# Patient Record
Sex: Female | Born: 1960
Health system: Southern US, Community
[De-identification: ages and names within clinical notes are randomized; demographics above are authoritative.]

## PROBLEM LIST (undated history)

## (undated) DIAGNOSIS — E785 Hyperlipidemia, unspecified: Secondary | ICD-10-CM

## (undated) DIAGNOSIS — A879 Viral meningitis, unspecified: Secondary | ICD-10-CM

## (undated) DIAGNOSIS — Z8742 Personal history of other diseases of the female genital tract: Secondary | ICD-10-CM

## (undated) DIAGNOSIS — I10 Essential (primary) hypertension: Secondary | ICD-10-CM

## (undated) DIAGNOSIS — D72819 Decreased white blood cell count, unspecified: Secondary | ICD-10-CM

## (undated) DIAGNOSIS — B009 Herpesviral infection, unspecified: Secondary | ICD-10-CM

## (undated) DIAGNOSIS — D219 Benign neoplasm of connective and other soft tissue, unspecified: Secondary | ICD-10-CM

## (undated) DIAGNOSIS — D649 Anemia, unspecified: Secondary | ICD-10-CM

## (undated) HISTORY — DX: Essential (primary) hypertension: I10

## (undated) HISTORY — DX: Benign neoplasm of connective and other soft tissue, unspecified: D21.9

## (undated) HISTORY — DX: Personal history of other diseases of the female genital tract: Z87.42

## (undated) HISTORY — DX: Anemia, unspecified: D64.9

## (undated) HISTORY — PX: WISDOM TOOTH EXTRACTION: SHX21

## (undated) HISTORY — DX: Hyperlipidemia, unspecified: E78.5

## (undated) HISTORY — DX: Decreased white blood cell count, unspecified: D72.819

## (undated) HISTORY — DX: Viral meningitis, unspecified: A87.9

## (undated) HISTORY — DX: Herpesviral infection, unspecified: B00.9

---

## 2000-04-10 ENCOUNTER — Other Ambulatory Visit: Admission: RE | Admit: 2000-04-10 | Discharge: 2000-04-10 | Payer: Self-pay | Admitting: Obstetrics and Gynecology

## 2000-07-15 ENCOUNTER — Encounter (INDEPENDENT_AMBULATORY_CARE_PROVIDER_SITE_OTHER): Payer: Self-pay | Admitting: Specialist

## 2000-07-15 ENCOUNTER — Other Ambulatory Visit: Admission: RE | Admit: 2000-07-15 | Discharge: 2000-07-15 | Payer: Self-pay | Admitting: Obstetrics and Gynecology

## 2001-03-12 ENCOUNTER — Ambulatory Visit (HOSPITAL_COMMUNITY): Admission: RE | Admit: 2001-03-12 | Discharge: 2001-03-12 | Payer: Self-pay | Admitting: Obstetrics and Gynecology

## 2001-03-12 ENCOUNTER — Encounter: Payer: Self-pay | Admitting: Obstetrics and Gynecology

## 2001-04-10 ENCOUNTER — Other Ambulatory Visit: Admission: RE | Admit: 2001-04-10 | Discharge: 2001-04-10 | Payer: Self-pay | Admitting: Obstetrics and Gynecology

## 2002-04-13 ENCOUNTER — Other Ambulatory Visit: Admission: RE | Admit: 2002-04-13 | Discharge: 2002-04-13 | Payer: Self-pay | Admitting: Obstetrics and Gynecology

## 2003-04-02 ENCOUNTER — Ambulatory Visit (HOSPITAL_COMMUNITY): Admission: RE | Admit: 2003-04-02 | Discharge: 2003-04-02 | Payer: Self-pay | Admitting: Obstetrics and Gynecology

## 2003-04-02 ENCOUNTER — Encounter: Payer: Self-pay | Admitting: Obstetrics and Gynecology

## 2003-06-29 ENCOUNTER — Other Ambulatory Visit: Admission: RE | Admit: 2003-06-29 | Discharge: 2003-06-29 | Payer: Self-pay | Admitting: Obstetrics and Gynecology

## 2003-12-13 ENCOUNTER — Ambulatory Visit (HOSPITAL_COMMUNITY): Admission: RE | Admit: 2003-12-13 | Discharge: 2003-12-13 | Payer: Self-pay | Admitting: Family Medicine

## 2005-01-16 ENCOUNTER — Other Ambulatory Visit: Admission: RE | Admit: 2005-01-16 | Discharge: 2005-01-16 | Payer: Self-pay | Admitting: Obstetrics and Gynecology

## 2006-02-13 ENCOUNTER — Other Ambulatory Visit: Admission: RE | Admit: 2006-02-13 | Discharge: 2006-02-13 | Payer: Self-pay | Admitting: Obstetrics and Gynecology

## 2007-06-26 ENCOUNTER — Ambulatory Visit (HOSPITAL_COMMUNITY): Admission: RE | Admit: 2007-06-26 | Discharge: 2007-06-26 | Payer: Self-pay | Admitting: Obstetrics and Gynecology

## 2007-11-27 DIAGNOSIS — D72819 Decreased white blood cell count, unspecified: Secondary | ICD-10-CM

## 2007-11-27 HISTORY — DX: Decreased white blood cell count, unspecified: D72.819

## 2008-06-26 DIAGNOSIS — A879 Viral meningitis, unspecified: Secondary | ICD-10-CM

## 2008-06-26 HISTORY — DX: Viral meningitis, unspecified: A87.9

## 2008-07-04 ENCOUNTER — Inpatient Hospital Stay (HOSPITAL_COMMUNITY): Admission: EM | Admit: 2008-07-04 | Discharge: 2008-07-07 | Payer: Self-pay | Admitting: Emergency Medicine

## 2010-02-26 ENCOUNTER — Emergency Department (HOSPITAL_COMMUNITY): Admission: EM | Admit: 2010-02-26 | Discharge: 2010-02-26 | Payer: Self-pay | Admitting: Emergency Medicine

## 2010-12-17 ENCOUNTER — Encounter: Payer: Self-pay | Admitting: Obstetrics and Gynecology

## 2011-01-31 ENCOUNTER — Other Ambulatory Visit (HOSPITAL_COMMUNITY): Payer: Self-pay | Admitting: Family Medicine

## 2011-01-31 DIAGNOSIS — Z139 Encounter for screening, unspecified: Secondary | ICD-10-CM

## 2011-02-01 ENCOUNTER — Ambulatory Visit (HOSPITAL_COMMUNITY)
Admission: RE | Admit: 2011-02-01 | Discharge: 2011-02-01 | Disposition: A | Discharge status: Home / Self Care | Payer: BC Managed Care – PPO | Source: Ambulatory Visit | Attending: Family Medicine | Admitting: Family Medicine

## 2011-02-01 DIAGNOSIS — Z780 Asymptomatic menopausal state: Secondary | ICD-10-CM | POA: Insufficient documentation

## 2011-02-01 DIAGNOSIS — Z139 Encounter for screening, unspecified: Secondary | ICD-10-CM

## 2011-02-01 DIAGNOSIS — Z1231 Encounter for screening mammogram for malignant neoplasm of breast: Secondary | ICD-10-CM | POA: Insufficient documentation

## 2011-02-01 DIAGNOSIS — Z13820 Encounter for screening for osteoporosis: Secondary | ICD-10-CM | POA: Insufficient documentation

## 2011-02-01 LAB — HM DEXA SCAN

## 2011-02-14 LAB — COMPREHENSIVE METABOLIC PANEL
ALT: 25 U/L (ref 0–35)
CO2: 24 mEq/L (ref 19–32)
Calcium: 9.1 mg/dL (ref 8.4–10.5)
GFR calc non Af Amer: >60 mL/min (ref >60)
Glucose, Bld: 145 mg/dL — ABNORMAL HIGH (ref 70–99)
Sodium: 136 mEq/L (ref 135–145)

## 2011-02-14 LAB — CBC
HCT: 36 % (ref 36.0–46.0)
Hemoglobin: 12.1 g/dL (ref 12.0–15.0)
MCHC: 33.6 g/dL (ref 30.0–36.0)
MCV: 88.1 fL (ref 78.0–100.0)
RBC: 4.09 MIL/uL (ref 3.87–5.11)

## 2011-02-14 LAB — LIPASE, BLOOD: Lipase: 18 U/L (ref 11–59)

## 2011-02-14 LAB — URINE MICROSCOPIC-ADD ON

## 2011-02-14 LAB — DIFFERENTIAL
Basophils Relative: 0 % (ref 0–1)
Eosinophils Absolute: 0 10*3/uL (ref 0.0–0.7)
Lymphs Abs: 0.4 10*3/uL — ABNORMAL LOW (ref 0.7–4.0)
Neutrophils Relative %: 94 % — ABNORMAL HIGH (ref 43–77)

## 2011-02-14 LAB — URINALYSIS, ROUTINE W REFLEX MICROSCOPIC
Bilirubin Urine: NEGATIVE
Glucose, UA: NEGATIVE mg/dL
Hgb urine dipstick: NEGATIVE
Ketones, ur: NEGATIVE mg/dL

## 2011-04-10 NOTE — H&P (Signed)
[name], [name]              ACCOUNT NO.:  [id number]   MEDICAL RECORD NO.:  [id number]          PATIENT TYPE:  INP   LOCATION:  A325                          FACILITY:  [facility]   PHYSICIAN:  Dorris Singh, DO    DATE OF BIRTH:  1961-04-08   DATE OF ADMISSION:  07/04/2008  DATE OF DISCHARGE:  LH                              HISTORY & PHYSICAL   HISTORY OF PRESENT ILLNESS:  The patient is a 50 year old female who is  a patient of Dr. Gerda Diss, who presented to the emergency room with her  spouse.  Per history about 4 days ago, the patient recounts having some  severe leg pain that then went into her back and then last night the  headache started to change.  She actually started to get a headache in  her temples that radiated to her back and when she flexes her neck.  At  the point in time, she has not had any chills, but still was stating  that she felt very sick.  She does report an exposure to [facility] Fever over the last week.  The patient said the pain was  worsening.  She was photophobic and nauseated but denies any type of  rash, any vomiting or sore throat.   PAST MEDICAL HISTORY:  Extensive for anemia and fibroids.   SOCIAL HISTORY:  She currently is married.  She is nonsmoker and nondrug  user and does occasionally drink.   ALLERGIES:  She does not have any known allergies.   GYNECOLOGY HISTORY:  Her last period was June 09, 2008.   CURRENT MEDICATION:  Iron supplementation.   REVIEW OF SYSTEMS:  CONSTITUTIONAL:  Positive for weakness, fever,  chills.  EYES:  Positive for photophobia.  EARS, NOSE, MOUTH AND THROAT:  Negative for sore throat, changes in voice and changes in hearing.  HEART:  Negative for chest pain or palpitations.  PULMONARY:  Negative  for wheezing or shortness of breath.  GU:  Positive for nausea, negative  for vomiting.  GU: Positive for hematuria.  MUSCULOSKELETAL:  Positive  for pain with flexion of neck.   PHYSICAL EXAMINATION:   VITAL SIGNS:  Blood pressure 152/84, pulse rate  81, respirations 16, temperature 99.4.  GENERAL:  The patient is a 50 year old African American female who is  resting comfortably in the room.  She is not opening up her eyes, but is  able to answer questions.  HEENT:  Normocephalic, atraumatic.  The patient refused to open eyes for  eye exam.  Ears, Nose, Mouth and Throat:  There is no erythema or  exudate noted.  Turbinates are moist.  NECK:  There is pain when she flexes her neck.  CARDIOVASCULAR:  Regular rate and rhythm.  No murmurs, gallops or rubs  noted.  RESPIRATORY:  Clear to auscultation bilaterally.  No rales, wheezes or  rhonchi.  ABDOMEN:  Soft, nontender, nondistended.  Bowel sounds in all four  quadrants.  EXTREMITIES:  No abnormalities or deformity.  No edema.  Full range of  motion.  SKIN:  Good color, good texture.  No  rash noticed on both lower  extremities.  Cranial nerves II-XII grossly intact.  The patient is A&O  x3.   STUDIES:  The patient had a lumbar puncture done.  Her CSF cell count,  white cell count is 259, neutrophils 33 lymphocytes 59.  Also, was  colorless in terms of clear.  The total protein of CSF was 129, glucose  of CSF was 66.  White count 9.1, hemoglobin 9.7, hematocrit 31.2,  platelet count 335.  Sodium 135, potassium 3.1, chloride 105, CO2 25,  glucose 156, BUN 10 and creatinine 1.1.  CT of the head is no acute  intracranial abnormality.   ASSESSMENT/PLAN:  Meningitis, rule out viral versus bacterial.  Add  hypokalemia and anemia.   PLAN:  Will admit the patient to the service of [facility].  Will  transfer care on August 10 to Dr. Fletcher Anon service.  Will place the  patient on a regular diet and increased as tolerated.  Will obtain a.m.  labs.  Also, will hydrate extensively.  It is recommended to the husband  that she have eye mask to help her with photophobia.  The patient has  been put on contact precautions, as well as, we have  ordered CSF  cultures, also get a UA, CNS on her, will also replace her potassium.  DVT prophylaxis and GI prophylaxis.  Have made her bedrest until she is  more stable and will continue to monitor her progress.  I have discussed  extensively with husband contact precautions for family.  He states  understanding as well as his wife.      Dorris Singh, DO  Electronically Signed     CB/MEDQ  D:  07/04/2008  T:  07/04/2008  Job:  [phone]

## 2011-04-10 NOTE — Group Therapy Note (Signed)
NAME:  [name], [name]              ACCOUNT NO.:  [id number]   MEDICAL RECORD NO.:  [id number]          PATIENT TYPE:  INP   LOCATION:  A325                          FACILITY:  [facility]   PHYSICIAN:  Scott A. Gerda Diss, MD    DATE OF BIRTH:  1961/11/24   DATE OF PROCEDURE:  DATE OF DISCHARGE:                                 PROGRESS NOTE   Overall, the patient __________ with some nausea.  Headache is now 6/10;  however, it was 10/10.  She did have some fever in the last 24 hours.  She is on ceftriaxone.  Her culture so far is negative.  Her cell count  on the CSF leaned toward her having a viral __________.  She is  urinating well; will reduce her fluid rate.  Her white count has  improved and her potassium was good as well.  I would expect her to be  in the hospital for a couple more days and we will watch the cultures.   PHYSICAL EXAMINATION:  LUNGS:  Clear.  HEART:  Regular.  NECK:  Bilateral soreness with flexion.    Dictation ended at this point.      Scott A. Gerda Diss, MD  Electronically Signed     SAL/MEDQ  D:  07/05/2008  T:  07/05/2008  Job:  [id number]

## 2011-04-13 NOTE — Procedures (Signed)
NAME:  DAKODA, BASSETTE                        ACCOUNT NO.:  [id number]   MEDICAL RECORD NO.:  [id number]                   PATIENT TYPE:  OUT   LOCATION:  RAD                                  FACILITY:  [facility]   PHYSICIAN:  Genoa Bing, M.D.               DATE OF BIRTH:  10-Dec-1960   DATE OF PROCEDURE:  12/13/2003  DATE OF DISCHARGE:                                  ECHOCARDIOGRAM   CLINICAL DATA:  A 50 year old woman with murmur.   M-MODE TRACINGS:  Aorta 2.2.   Left atrium 2.8.   Septum 1.0.   Posterior wall 1.0.   Left ventricular diastole 4.1.   Left ventricular systole 3.2.   IMPRESSION:  1. Technically adequate echocardiographic study.  2. Normal left atrium, right atrium, and right ventricle.  3. Normal aorta, mitral, tricuspid, and pulmonic valves.  4. Normal internal dimension, wall thickness, regional and global function     of the left ventricle.  5. Normal inferior vena cava.  6. Normal Doppler study - physiologic tricuspid regurgitation.      ___________________________________________                                            Bainbridge Bing, M.D.   RR/MEDQ  D:  12/13/2003  T:  12/14/2003  Job:  [id number]

## 2011-04-13 NOTE — Discharge Summary (Signed)
NAME:  ANALENA, GAMA              ACCOUNT NO.:  [id number]   MEDICAL RECORD NO.:  [id number]          PATIENT TYPE:  INP   LOCATION:  A325                          FACILITY:  [facility]   PHYSICIAN:  Scott A. Gerda Diss, MD    DATE OF BIRTH:  February 06, 1961   DATE OF ADMISSION:  07/04/2008  DATE OF DISCHARGE:  08/12/2009LH                               DISCHARGE SUMMARY   DISCHARGE DIAGNOSES:  1. Viral meningitis.  2. Hypopotassemia.  3. Anemia.   HOSPITAL COURSE:  This patient was admitted in on July 04, 2008 by the  hospitalist and had a CSF done, was covered with antibiotics.  The  cultures came back negative.  The patient was seen on July 05, 2008;  July 06, 2008; and July 07, 2008 by [place], and felt to have viral  meningitis.  It should be noted that the patient gradually improved and  was able to tolerate meds and was sent home on pain medications,  instruction to follow up in the office in the near future.  She also has  anemia that is felt to be due to her uterine fibroids, and her  hemoglobin on discharge was 10.3, and it should also be noted that her  potassium was a little low when she first came in, but that was  supplemented and came up.  She was discharged in good condition.  Follow  up in the office soon.      Scott A. Gerda Diss, MD  Electronically Signed     SAL/MEDQ  D:  08/09/2008  T:  08/10/2008  Job:  [id number]

## 2011-08-24 LAB — URINALYSIS, ROUTINE W REFLEX MICROSCOPIC
Bilirubin Urine: NEGATIVE
Glucose, UA: 100 — AB
Ketones, ur: NEGATIVE
Specific Gravity, Urine: 1.025
pH: 6

## 2011-08-24 LAB — DIFFERENTIAL
Basophils Absolute: 0
Basophils Relative: 0
Eosinophils Absolute: 0
Eosinophils Relative: 0
Eosinophils Relative: 5
Lymphocytes Relative: 29
Lymphocytes Relative: 36
Lymphocytes Relative: 9 — ABNORMAL LOW
Lymphs Abs: 1.1
Lymphs Abs: 1.6
Monocytes Absolute: 0.5
Monocytes Relative: 14 — ABNORMAL HIGH
Monocytes Relative: 2 — ABNORMAL LOW
Neutro Abs: 8.1 — ABNORMAL HIGH
Neutrophils Relative %: 57

## 2011-08-24 LAB — CBC
HCT: 30.8 — ABNORMAL LOW
HCT: 31.2 — ABNORMAL LOW
HCT: 32.5 — ABNORMAL LOW
Hemoglobin: 10.3 — ABNORMAL LOW
MCHC: 31.1
MCV: 79
MCV: 80.3
Platelets: 258
Platelets: 335
RBC: 3.83 — ABNORMAL LOW
RDW: 21.6 — ABNORMAL HIGH
WBC: 3.2 — ABNORMAL LOW
WBC: 5.5

## 2011-08-24 LAB — PROTIME-INR: INR: 1.2

## 2011-08-24 LAB — URINE MICROSCOPIC-ADD ON

## 2011-08-24 LAB — URINE CULTURE: Special Requests: NEGATIVE

## 2011-08-24 LAB — CSF CELL COUNT WITH DIFFERENTIAL
Eosinophils, CSF: 0
Eosinophils, CSF: 0
Lymphs, CSF: 59
Lymphs, CSF: 69
Monocyte-Macrophage-Spinal Fluid: 5 — ABNORMAL LOW
Monocyte-Macrophage-Spinal Fluid: 8 — ABNORMAL LOW
Other Cells, CSF: 0
RBC Count, CSF: 14 — ABNORMAL HIGH
Tube #: 1
Tube #: 4
WBC, CSF: 192 — ABNORMAL HIGH

## 2011-08-24 LAB — CULTURE, BLOOD (ROUTINE X 2)
Culture: NO GROWTH
Report Status: 8142009

## 2011-08-24 LAB — BASIC METABOLIC PANEL
BUN: 3 — ABNORMAL LOW
Chloride: 109
GFR calc Af Amer: >60
GFR calc non Af Amer: >60
GFR calc non Af Amer: >60
Potassium: 3.6
Potassium: 3.9
Sodium: 135
Sodium: 139

## 2011-08-24 LAB — IRON AND TIBC: UIBC: 290

## 2011-08-24 LAB — COMPREHENSIVE METABOLIC PANEL
Albumin: 3.7
BUN: 10
Calcium: 8.9
Creatinine, Ser: 1.01
Total Protein: 7.5

## 2011-08-24 LAB — CSF CULTURE W GRAM STAIN

## 2011-08-24 LAB — APTT: aPTT: 28

## 2011-08-24 LAB — FERRITIN: Ferritin: 15 (ref 10–291)

## 2011-08-24 LAB — GLUCOSE, CSF: Glucose, CSF: 66

## 2011-08-24 LAB — VDRL, CSF: VDRL Quant, CSF: NONREACTIVE

## 2012-09-23 ENCOUNTER — Encounter: Payer: Self-pay | Admitting: Internal Medicine

## 2012-09-26 HISTORY — PX: COLONOSCOPY: SHX174

## 2012-09-29 ENCOUNTER — Ambulatory Visit (AMBULATORY_SURGERY_CENTER): Payer: BC Managed Care – PPO | Admitting: *Deleted

## 2012-09-29 VITALS — Ht 68.0 in | Wt 142.0 lb

## 2012-09-29 DIAGNOSIS — Z1211 Encounter for screening for malignant neoplasm of colon: Secondary | ICD-10-CM

## 2012-09-29 MED ORDER — NA SULFATE-K SULFATE-MG SULF 17.5-3.13-1.6 GM/177ML PO SOLN: ORAL | Status: DC

## 2012-10-07 ENCOUNTER — Telehealth: Payer: Self-pay | Admitting: Internal Medicine

## 2012-10-07 NOTE — Telephone Encounter (Signed)
Talked with pt's pharmacy: Rx for Suprep is ready for pt to pick up.  Pt is aware

## 2012-10-08 ENCOUNTER — Encounter: Payer: Self-pay | Admitting: Internal Medicine

## 2012-10-08 ENCOUNTER — Ambulatory Visit (AMBULATORY_SURGERY_CENTER): Payer: BC Managed Care – PPO | Admitting: Internal Medicine

## 2012-10-08 VITALS — BP 130/78 | HR 75 | Temp 98.6°F | Resp 22 | Ht 68.0 in | Wt 142.0 lb

## 2012-10-08 DIAGNOSIS — Z1211 Encounter for screening for malignant neoplasm of colon: Secondary | ICD-10-CM

## 2012-10-08 LAB — HM COLONOSCOPY: HM Colonoscopy: NORMAL

## 2012-10-08 MED ORDER — SODIUM CHLORIDE 0.9 % IV SOLN: 500.0000 mL | INTRAVENOUS | Status: DC

## 2012-10-08 NOTE — Op Note (Signed)
[facility] [address] [place], [address]   COLONOSCOPY PROCEDURE REPORT  PATIENT: [name], [name]  MR#: [id number] BIRTHDATE: 09-07-1961 , 51  yrs. old GENDER: Female ENDOSCOPIST: Iva Boop, MD, [facility] REFERRED XB:MWUXL Gerda Diss, M.D. PROCEDURE DATE:  10/08/2012 PROCEDURE:   Colonoscopy, diagnostic ASA CLASS:   Class II INDICATIONS:average risk screening. MEDICATIONS: propofol (Diprivan) 200mg  IV, MAC sedation, administered by CRNA, and These medications were titrated to patient response per physician's verbal order  DESCRIPTION OF PROCEDURE:   After the risks benefits and alternatives of the procedure were thoroughly explained, informed consent was obtained.  A digital rectal exam revealed no abnormalities of the rectum.   The LB CF-[id number]  endoscope was introduced through the anus and advanced to the cecum, which was identified by both the appendix and ileocecal valve. No adverse events experienced.   The quality of the prep was Suprep excellent The instrument was then slowly withdrawn as the colon was fully examined.      COLON FINDINGS: The colonic mucosa appeared normal throughout the entire examined colon.  Retroflexed views revealed no abnormalities. The time to cecum=5 minutes 20 seconds.  Withdrawal time=6 minutes 47 seconds.  The scope was withdrawn and the procedure completed. COMPLICATIONS: There were no complications.  ENDOSCOPIC IMPRESSION: The colonic and rectal mucosa appeared normal throughout the entire examined colon and there was an excellent prep  RECOMMENDATIONS: Repeat colonoscopy 10 years.   eSigned:  Iva Boop, MD, [facility] 10/08/2012 11:18 AM   cc: Lilyan Punt, MD and The Patient

## 2012-10-08 NOTE — Progress Notes (Signed)
Patient did not experience any of the following events: a burn prior to discharge; a fall within the facility; wrong site/side/patient/procedure/implant event; or a hospital transfer or hospital admission upon discharge from the facility. (G8907) Patient did not have preoperative order for IV antibiotic SSI prophylaxis. (G8918)  

## 2012-10-08 NOTE — Patient Instructions (Addendum)
The colonoscopy was normal with an excellent prep.  Next routine preventive colonoscopy 2023.  Thank you for choosing me and [facility].  Iva Boop, MD, FACG  YOU HAD AN ENDOSCOPIC PROCEDURE TODAY AT THE  ENDOSCOPY CENTER: Refer to the procedure report that was given to you for any specific questions about what was found during the examination.  If the procedure report does not answer your questions, please call your gastroenterologist to clarify.  If you requested that your care partner not be given the details of your procedure findings, then the procedure report has been included in a sealed envelope for you to review at your convenience later.  YOU SHOULD EXPECT: Some feelings of bloating in the abdomen. Passage of more gas than usual.  Walking can help get rid of the air that was put into your GI tract during the procedure and reduce the bloating. If you had a lower endoscopy (such as a colonoscopy or flexible sigmoidoscopy) you may notice spotting of blood in your stool or on the toilet paper. If you underwent a bowel prep for your procedure, then you may not have a normal bowel movement for a few days.  DIET: Your first meal following the procedure should be a light meal and then it is ok to progress to your normal diet.  A half-sandwich or bowl of soup is an example of a good first meal.  Heavy or fried foods are harder to digest and may make you feel nauseous or bloated.  Likewise meals heavy in dairy and vegetables can cause extra gas to form and this can also increase the bloating.  Drink plenty of fluids but you should avoid alcoholic beverages for 24 hours.  ACTIVITY: Your care partner should take you home directly after the procedure.  You should plan to take it easy, moving slowly for the rest of the day.  You can resume normal activity the day after the procedure however you should NOT DRIVE or use heavy machinery for 24 hours (because of the sedation  medicines used during the test).    SYMPTOMS TO REPORT IMMEDIATELY: A gastroenterologist can be reached at any hour.  During normal business hours, 8:30 AM to 5:00 PM Monday through Friday, call [phone].  After hours and on weekends, please call the GI answering service at [phone] who will take a message and have the physician on call contact you.   Following lower endoscopy (colonoscopy or flexible sigmoidoscopy):  Excessive amounts of blood in the stool  Significant tenderness or worsening of abdominal pains  Swelling of the abdomen that is new, acute  Fever of 100F or higher  FOLLOW UP: If any biopsies were taken you will be contacted by phone or by letter within the next 1-3 weeks.  Call your gastroenterologist if you have not heard about the biopsies in 3 weeks.  Our staff will call the home number listed on your records the next business day following your procedure to check on you and address any questions or concerns that you may have at that time regarding the information given to you following your procedure. This is a courtesy call and so if there is no answer at the home number and we have not heard from you through the emergency physician on call, we will assume that you have returned to your regular daily activities without incident.  SIGNATURES/CONFIDENTIALITY: You and/or your care partner have signed paperwork which will be entered into your electronic medical record.  These signatures attest to the fact that that the information above on your After Visit Summary has been reviewed and is understood.  Full responsibility of the confidentiality of this discharge information lies with you and/or your care-partner.  

## 2012-10-08 NOTE — Progress Notes (Signed)
Propofol given over incremental dosages 

## 2012-10-09 ENCOUNTER — Telehealth: Payer: Self-pay

## 2012-10-09 NOTE — Telephone Encounter (Signed)
Left message on answering machine. 

## 2012-10-16 ENCOUNTER — Encounter: Payer: Self-pay | Admitting: Obstetrics and Gynecology

## 2012-10-16 ENCOUNTER — Ambulatory Visit (INDEPENDENT_AMBULATORY_CARE_PROVIDER_SITE_OTHER): Payer: BC Managed Care – PPO | Admitting: Obstetrics and Gynecology

## 2012-10-16 VITALS — BP 120/78 | HR 80 | Ht 68.0 in | Wt 143.5 lb

## 2012-10-16 DIAGNOSIS — Z01419 Encounter for gynecological examination (general) (routine) without abnormal findings: Secondary | ICD-10-CM

## 2012-10-16 DIAGNOSIS — Z124 Encounter for screening for malignant neoplasm of cervix: Secondary | ICD-10-CM

## 2012-10-16 DIAGNOSIS — N898 Other specified noninflammatory disorders of vagina: Secondary | ICD-10-CM

## 2012-10-16 DIAGNOSIS — Z113 Encounter for screening for infections with a predominantly sexual mode of transmission: Secondary | ICD-10-CM

## 2012-10-16 DIAGNOSIS — Z1239 Encounter for other screening for malignant neoplasm of breast: Secondary | ICD-10-CM

## 2012-10-16 MED ORDER — AMBULATORY NON FORMULARY MEDICATION: 1.0000 | Status: DC

## 2012-10-16 NOTE — Progress Notes (Signed)
PT WANT TO GET PAP TEST TODAY. LAST MAMMOGRAM: 2 YEARS AGO  LAST PAP:  1/12 AND PT COLONOSCOPY :11/13

## 2012-10-16 NOTE — Patient Instructions (Signed)
Avoid: - excess soap on genital area (consider using plain oatmeal soap) - use of powder or sprays in genital area - douching - wearing underwear to bed (except with menses) - using more than is directed detergent when washing clothes - tight fitting garments around genital area - excess sugar intake  [place] pharmacies that carry Boric Acid Capsules/Suppositories:  [facility] [address] (only)  [phone]  [facility]  [address] ([facility])  [phone]  [facility] ([place])  [address] [phone]  [facility]  [address] [phone]  [facility] [address] [place], [place] [phone]     

## 2012-10-16 NOTE — Progress Notes (Addendum)
Color: PINK AFTER PT CYCLE Odor: yes Itching:no Thin:yes Thick:no Fever:no Dyspareunia:no Hx PID:no HX STD:yes Pelvic Pain:no Desires Gc/CT:YES Desires HIV,RPR,HbsAG:no JUST HIV

## 2012-10-16 NOTE — Progress Notes (Signed)
Subjective:    [name] is a 51 y.o. female, G2P0, who presents for an annual exam. The patient reports a pink vaginal discharge after menses, lasting for 2 weeks.  Menstrual cycle:   LMP: Patient's last menstrual period was 09/24/2012.  Flow for 7 days with pad change every hour-2 hours with some clots, denies cramps.           has on occasion missed periods with some vasomotor symptoms.  Review of Systems Pertinent items are noted in HPI. Denies pelvic pain, urinary tract symptoms, vaginitis symptoms, irregular bleeding, menopausal symptoms, change in bowel habits or rectal bleeding   Objective:    BP 120/78  Pulse 80  Ht 5\' 8"  (1.727 m)  Wt 143 lb 8 oz (65.091 kg)  BMI 21.82 kg/m2  LMP 09/24/2012    Wt Readings from Last 1 Encounters:  10/16/12 143 lb 8 oz (65.091 kg)   Body mass index is 21.82 kg/(m^2). General Appearance: Alert, no acute distress HEENT: Grossly normal Neck / Thyroid: Supple, no thyromegaly or cervical adenopathy Lungs: Clear to auscultation bilaterally Back: No CVA tenderness Breast Exam: No masses or nodes.No dimpling, nipple retraction or discharge. Cardiovascular: Regular rate and rhythm.  Gastrointestinal: Soft, non-tender, no masses or organomegaly Pelvic Exam: EGBUS-wnl, vagina-normal rugae, cervix- without lesions or tenderness, uterus appears normal size shape and consistency, adnexae-no masses or tenderness Rectovaginal: deferred, patient had a colonoscopy 1 week ago Lymphatic Exam: Non-palpable nodes in neck, clavicular,  axillary, or inguinal regions  Skin: no rashes or abnormalities Extremities: no clubbing cyanosis or edema  Neurologic: grossly normal Psychiatric: Alert and oriented  Wet Prep: pH-5.0, whiff-negative, Urinalysis: UPT:    Assessment:   Routine GYN Exam Fibroids Irregular Menses Shift in vaginal pH Irregular Bleeding S/P Tubal Sterilization   Plan:    PAP sent  Screening MG  Pelvic U/S for irregular  bleeding & fibroids  Perineal hygiene  Boric Acid Capsules 600 mg  #30 1 pv twice weekly for 4 weeks then prn 11 refills  Reviewed endometrial biopsy (has had  before)  RTO 1 year or prn  Erol Flanagin,ELMIRAPA-C

## 2012-10-18 LAB — PAP IG, CT-NG, RFX HPV ASCU

## 2012-11-06 ENCOUNTER — Other Ambulatory Visit: Payer: BC Managed Care – PPO

## 2012-11-06 ENCOUNTER — Encounter: Payer: BC Managed Care – PPO | Admitting: Obstetrics and Gynecology

## 2012-12-01 ENCOUNTER — Other Ambulatory Visit: Payer: Self-pay

## 2012-12-01 ENCOUNTER — Other Ambulatory Visit: Payer: Self-pay | Admitting: Obstetrics and Gynecology

## 2012-12-01 DIAGNOSIS — N926 Irregular menstruation, unspecified: Secondary | ICD-10-CM

## 2012-12-02 ENCOUNTER — Ambulatory Visit (INDEPENDENT_AMBULATORY_CARE_PROVIDER_SITE_OTHER): Payer: BC Managed Care – PPO

## 2012-12-02 ENCOUNTER — Ambulatory Visit (INDEPENDENT_AMBULATORY_CARE_PROVIDER_SITE_OTHER): Payer: BC Managed Care – PPO | Admitting: Obstetrics and Gynecology

## 2012-12-02 ENCOUNTER — Encounter: Payer: Self-pay | Admitting: Obstetrics and Gynecology

## 2012-12-02 VITALS — BP 120/80 | Wt 144.0 lb

## 2012-12-02 DIAGNOSIS — D259 Leiomyoma of uterus, unspecified: Secondary | ICD-10-CM

## 2012-12-02 DIAGNOSIS — D219 Benign neoplasm of connective and other soft tissue, unspecified: Secondary | ICD-10-CM

## 2012-12-02 DIAGNOSIS — N926 Irregular menstruation, unspecified: Secondary | ICD-10-CM

## 2012-12-02 NOTE — Progress Notes (Signed)
HISTORY OF PRESENT ILLNESS  [name] is a 52 y.o. year old female,G2P0, who presents for a problem visit. The patient has a known history of fibroids.  She has irregular bleeding.  She has some menopausal symptoms.  An endometrial biopsy in 2010 showed benign cells.  Subjective:  See above and below.  Objective:  BP 120/80  Wt 144 lb (65.318 kg)  LMP 11/25/2012   General: no distress  Exam deferred.  Ultrasound: Uterus is 9.89 x 9.14 cm.  2 fibroids were seen with the largest fibroid measuring 5.09 cm.  Endometrium measures 5.98 mm.  Both ovaries appear normal.  Assessment:  Irregular bleeding believed to be secondary to perimenopausal changes.  Fibroid uterus.  Plan:  We discussed the evaluation of irregular bleeding in a perimenopausal woman. I offered repeat endometrial biopsy.  Because the patient has had a negative endometrial biopsy in the recent past, and because her endometrium measures 5.98 mm, the patient feels that it is unlikely that she has a malignancy inside the uterus.  She understands that it is a small possibility.  She elects to observe only for now.  She will return for her routine screening.  She will call if her symptoms get worse.   Leonard Schwartz M.D.  12/02/2012 6:09 PM    When did bleeding start: pt states she is unsure How  Long: the irregular bleeding has been an issue x 6 months.  How often changing pad/tampon: once a day Bleeding Disorders: no Cramping: no Contraception: no Fibroids: yes Hormone Therapy: no New Medications: no Menopausal Symptoms: yes Vag. Discharge: yes "Sometimes discharge is pink"  Abdominal Pain: no Increased Stress: no

## 2013-03-23 ENCOUNTER — Telehealth: Payer: Self-pay | Admitting: Family Medicine

## 2013-03-23 MED ORDER — VALACYCLOVIR HCL 500 MG PO TABS: 500.0000 mg | ORAL_TABLET | Freq: Every day | ORAL | Status: DC | PRN

## 2013-03-23 NOTE — Telephone Encounter (Signed)
Patient needs a refill of her Valacyclovir to [facility]

## 2013-03-23 NOTE — Telephone Encounter (Signed)
Refill x6

## 2013-03-24 ENCOUNTER — Telehealth: Payer: Self-pay | Admitting: *Deleted

## 2013-03-24 NOTE — Telephone Encounter (Signed)
Message of RF of acyclovir x 6 to pharmacy of choice

## 2013-04-08 ENCOUNTER — Telehealth: Payer: Self-pay | Admitting: Family Medicine

## 2013-04-08 DIAGNOSIS — R5381 Other malaise: Secondary | ICD-10-CM

## 2013-04-08 DIAGNOSIS — E782 Mixed hyperlipidemia: Secondary | ICD-10-CM

## 2013-04-08 DIAGNOSIS — Z79899 Other long term (current) drug therapy: Secondary | ICD-10-CM

## 2013-04-08 NOTE — Telephone Encounter (Signed)
Blood work papers printed and left up front for patient pick up. Patient notified. 

## 2013-04-08 NOTE — Telephone Encounter (Signed)
Pt would like bw papers for general wellness/insurance

## 2013-04-08 NOTE — Telephone Encounter (Signed)
Typically what we recommend is lipid profile, CBC, metabolic 7, vitamin D. Diagnosis for these is leukopenia, hyperlipidemia, hypertension, vitamin D deficiency. Tell the patient that if she would like to have her thyroid checked this could be added as well as a screening. If so I would order a TSH. Thank you.

## 2013-09-22 ENCOUNTER — Other Ambulatory Visit: Payer: Self-pay | Admitting: Family Medicine

## 2013-09-23 ENCOUNTER — Telehealth: Payer: Self-pay | Admitting: Family Medicine

## 2013-09-23 NOTE — Telephone Encounter (Signed)
Blood work orders are in [employer]. Patient notified.

## 2013-09-23 NOTE — Telephone Encounter (Signed)
Pt would like to reorder her bw that was supposed to be done in May that she never got done. Please call her when she can go

## 2013-09-25 ENCOUNTER — Other Ambulatory Visit: Payer: Self-pay | Admitting: Family Medicine

## 2013-09-25 LAB — LIPID PANEL
Cholesterol: 275 mg/dL — ABNORMAL HIGH (ref 0–200)
Total CHOL/HDL Ratio: 3.7 Ratio
Triglycerides: 87 mg/dL (ref <150)
VLDL: 17 mg/dL (ref 0–40)

## 2013-09-25 LAB — BASIC METABOLIC PANEL
Calcium: 9.9 mg/dL (ref 8.4–10.5)
Glucose, Bld: 90 mg/dL (ref 70–99)
Potassium: 3.6 mEq/L (ref 3.5–5.3)
Sodium: 142 mEq/L (ref 135–145)

## 2013-09-26 LAB — CBC WITH DIFFERENTIAL/PLATELET
Basophils Absolute: 0 10*3/uL (ref 0.0–0.1)
Basophils Relative: 2 % — ABNORMAL HIGH (ref 0–1)
Eosinophils Absolute: 0.1 10*3/uL (ref 0.0–0.7)
MCH: 29.9 pg (ref 26.0–34.0)
MCHC: 34.4 g/dL (ref 30.0–36.0)
Monocytes Relative: 7 % (ref 3–12)
Neutro Abs: 0.9 10*3/uL — ABNORMAL LOW (ref 1.7–7.7)
Neutrophils Relative %: 35 % — ABNORMAL LOW (ref 43–77)
Platelets: 273 10*3/uL (ref 150–400)
RDW: 15.5 % (ref 11.5–15.5)

## 2013-09-26 LAB — VITAMIN D 25 HYDROXY (VIT D DEFICIENCY, FRACTURES): Vit D, 25-Hydroxy: 49 ng/mL (ref 30–89)

## 2013-10-03 ENCOUNTER — Encounter: Payer: Self-pay | Admitting: *Deleted

## 2013-10-08 ENCOUNTER — Encounter: Payer: Self-pay | Admitting: Family Medicine

## 2013-10-08 ENCOUNTER — Ambulatory Visit (INDEPENDENT_AMBULATORY_CARE_PROVIDER_SITE_OTHER): Payer: BC Managed Care – PPO | Admitting: Family Medicine

## 2013-10-08 VITALS — BP 122/78 | Ht 67.0 in | Wt 140.8 lb

## 2013-10-08 DIAGNOSIS — E785 Hyperlipidemia, unspecified: Secondary | ICD-10-CM | POA: Insufficient documentation

## 2013-10-08 DIAGNOSIS — I10 Essential (primary) hypertension: Secondary | ICD-10-CM | POA: Insufficient documentation

## 2013-10-08 NOTE — Progress Notes (Signed)
  Subjective:    Patient ID: [name], female    DOB: [birth year], 52 y.o.   MRN: [id number]  HPIHere for a med check and follow up on blood work. Trying to eat healthy.some walking patient is compliant with her medicines she tries to watch her diet as closely as possible. She tries keep her exercise going well until recently. She denies any other particular troubles.  Declined flu vaccine.   Trouble with right ear for a few months. - intermittent pain, dental people stated possible root canal,feels tight, no sinus sx, no hearing issues, no ringing.no diiference with chewing Past medical history hypertension hyperlipidemia Family history hyperlipidemia Social does not smoke   Review of Systems see above     Objective:   Physical Exam Lungs clear hearts regular pulse normal blood pressure good extremities no edema skin warm dry eardrums are normal jaw normal inside mouth normal neck no masses       Assessment & Plan:  Right ear sensation-no sign of any type of abnormality if this progresses becomes pain or other problems should need to followup for further testing   hyperlipidemia has an extremely elevated LDL but HDL is also very good at risk of heart disease is low. It importance of dietary measures yearly lipid profile currently her 10 year risk comes out at 1% statins not indicated currently.  Hypertension good control proper diet exercise discussed.

## 2013-10-08 NOTE — Patient Instructions (Signed)
DASH Diet  The DASH diet stands for "Dietary Approaches to Stop Hypertension." It is a healthy eating plan that has been shown to reduce high blood pressure (hypertension) in as little as 14 days, while also possibly providing other significant health benefits. These other health benefits include reducing the risk of breast cancer after menopause and reducing the risk of type 2 diabetes, heart disease, colon cancer, and stroke. Health benefits also include weight loss and slowing kidney failure in patients with chronic kidney disease.   DIET GUIDELINES  · Limit salt (sodium). Your diet should contain less than 1500 mg of sodium daily.  · Limit refined or processed carbohydrates. Your diet should include mostly whole grains. Desserts and added sugars should be used sparingly.  · Include small amounts of heart-healthy fats. These types of fats include nuts, oils, and tub margarine. Limit saturated and trans fats. These fats have been shown to be harmful in the body.  CHOOSING FOODS   The following food groups are based on a 2000 calorie diet. See your Registered Dietitian for individual calorie needs.  Grains and Grain Products (6 to 8 servings daily)  · Eat More Often: Whole-wheat bread, brown rice, whole-grain or wheat pasta, quinoa, popcorn without added fat or salt (air popped).  · Eat Less Often: White bread, white pasta, white rice, cornbread.  Vegetables (4 to 5 servings daily)  · Eat More Often: Fresh, frozen, and canned vegetables. Vegetables may be raw, steamed, roasted, or grilled with a minimal amount of fat.  · Eat Less Often/Avoid: Creamed or fried vegetables. Vegetables in a cheese sauce.  Fruit (4 to 5 servings daily)  · Eat More Often: All fresh, canned (in natural juice), or frozen fruits. Dried fruits without added sugar. One hundred percent fruit juice (½ cup [237 mL] daily).  · Eat Less Often: Dried fruits with added sugar. Canned fruit in light or heavy syrup.  Lean Meats, Fish, and Poultry (2  servings or less daily. One serving is 3 to 4 oz [85-114 g]).  · Eat More Often: Ninety percent or leaner ground beef, tenderloin, sirloin. Round cuts of beef, chicken breast, turkey breast. All fish. Grill, bake, or broil your meat. Nothing should be fried.  · Eat Less Often/Avoid: Fatty cuts of meat, turkey, or chicken leg, thigh, or wing. Fried cuts of meat or fish.  Dairy (2 to 3 servings)  · Eat More Often: Low-fat or fat-free milk, low-fat plain or light yogurt, reduced-fat or part-skim cheese.  · Eat Less Often/Avoid: Milk (whole, 2%). Whole milk yogurt. Full-fat cheeses.  Nuts, Seeds, and Legumes (4 to 5 servings per week)  · Eat More Often: All without added salt.  · Eat Less Often/Avoid: Salted nuts and seeds, canned beans with added salt.  Fats and Sweets (limited)  · Eat More Often: Vegetable oils, tub margarines without trans fats, sugar-free gelatin. Mayonnaise and salad dressings.  · Eat Less Often/Avoid: Coconut oils, palm oils, butter, stick margarine, cream, half and half, cookies, candy, pie.  FOR MORE INFORMATION  The Dash Diet Eating Plan: www.dashdiet.org  Document Released: 11/01/2011 Document Revised: 02/04/2012 Document Reviewed: 11/01/2011  ExitCare® Patient Information ©2014 [employer].

## 2013-10-15 ENCOUNTER — Telehealth: Payer: Self-pay | Admitting: Family Medicine

## 2013-10-15 ENCOUNTER — Other Ambulatory Visit: Payer: Self-pay | Admitting: Family Medicine

## 2013-10-15 NOTE — Telephone Encounter (Signed)
error 

## 2013-10-16 ENCOUNTER — Other Ambulatory Visit: Payer: Self-pay | Admitting: Family Medicine

## 2013-12-08 ENCOUNTER — Ambulatory Visit: Payer: BC Managed Care – PPO | Admitting: Family Medicine

## 2014-03-10 ENCOUNTER — Telehealth: Payer: Self-pay | Admitting: Family Medicine

## 2014-03-10 MED ORDER — VALACYCLOVIR HCL 500 MG PO TABS: 500.0000 mg | ORAL_TABLET | Freq: Two times a day (BID) | ORAL | Status: DC

## 2014-03-10 NOTE — Addendum Note (Signed)
Addended by: Carmelina Noun on: 03/10/2014 03:40 PM   Modules accepted: Orders

## 2014-03-10 NOTE — Telephone Encounter (Signed)
Valtrex 500 , one bid as directed, #60, 6 RF

## 2014-03-10 NOTE — Telephone Encounter (Signed)
Pt is taking 2 on some days so she has run out early. Can a new script with new directions be sent to pharm.

## 2014-03-10 NOTE — Telephone Encounter (Signed)
Med sent to pharm. Pt notified on voicemail.  

## 2014-03-10 NOTE — Telephone Encounter (Signed)
Patient wants refill 14 days early on valacyclovie(Valtrex) 500mg  but she has one refill left on old prescription but she has to wait 14 days to get it filled and she doesn't want to wait that long but she not out of meds. She wants new prescription called in at [facility].

## 2014-07-08 ENCOUNTER — Encounter: Payer: Self-pay | Admitting: Nurse Practitioner

## 2014-07-08 ENCOUNTER — Ambulatory Visit (INDEPENDENT_AMBULATORY_CARE_PROVIDER_SITE_OTHER): Payer: BC Managed Care – PPO | Admitting: Nurse Practitioner

## 2014-07-08 VITALS — BP 122/80 | Temp 97.7°F | Ht 67.0 in | Wt 143.5 lb

## 2014-07-08 DIAGNOSIS — T148 Other injury of unspecified body region: Secondary | ICD-10-CM

## 2014-07-08 DIAGNOSIS — W57XXXA Bitten or stung by nonvenomous insect and other nonvenomous arthropods, initial encounter: Secondary | ICD-10-CM

## 2014-07-08 MED ORDER — CEPHALEXIN 500 MG PO CAPS: 500.0000 mg | ORAL_CAPSULE | Freq: Three times a day (TID) | ORAL | Status: DC

## 2014-07-09 ENCOUNTER — Encounter: Payer: Self-pay | Admitting: Nurse Practitioner

## 2014-07-09 ENCOUNTER — Telehealth: Payer: Self-pay | Admitting: Nurse Practitioner

## 2014-07-09 MED ORDER — DOXYCYCLINE HYCLATE 100 MG PO TABS: 100.0000 mg | ORAL_TABLET | Freq: Two times a day (BID) | ORAL | Status: DC

## 2014-07-09 NOTE — Telephone Encounter (Signed)
Patient said her symptoms have gotten worse. There is swelling and inflammation and discoloration where the insect bite was.

## 2014-07-09 NOTE — Progress Notes (Signed)
Subjective:  Presents complaints of a possible spider bite on the back of the neck area. First noticed this 4 days ago. Noticed a small knot yesterday. Mildly tender. Pruritic at times. No fever. No headache. No other rash.  Objective:   BP 122/80  Temp(Src) 97.7 F (36.5 C) (Oral)  Ht 5\' 7"  (1.702 m)  Wt 143 lb 8 oz (65.091 kg)  BMI 22.47 kg/m2 NAD. Alert, oriented. Lungs clear. Heart regular rate rhythm. A small pink raised papular lesion noted on the cervical area towards the left side, mildly tender to palpation. No drainage noted.  Assessment: Insect bite/possible spider bite  Plan: Initially patient was given prescription for Keflex. Call back today before chart was closed see phone note. Switch to doxycycline as directed. Warning signs reviewed, call back if worsens or persists. If significantly worse, patient to be seen in urgent care while she is out of town.

## 2014-07-09 NOTE — Telephone Encounter (Signed)
Medication faxed to pharmacy in [place] per patient request. Patient was notified.

## 2014-07-09 NOTE — Telephone Encounter (Signed)
[facility]

## 2014-07-27 ENCOUNTER — Other Ambulatory Visit: Payer: Self-pay | Admitting: Family Medicine

## 2014-08-27 ENCOUNTER — Other Ambulatory Visit: Payer: Self-pay | Admitting: Family Medicine

## 2014-09-14 ENCOUNTER — Telehealth: Payer: Self-pay | Admitting: Family Medicine

## 2014-09-14 ENCOUNTER — Other Ambulatory Visit (HOSPITAL_COMMUNITY): Payer: Self-pay | Admitting: Family Medicine

## 2014-09-14 DIAGNOSIS — Z1231 Encounter for screening mammogram for malignant neoplasm of breast: Secondary | ICD-10-CM

## 2014-09-14 DIAGNOSIS — E876 Hypokalemia: Secondary | ICD-10-CM

## 2014-09-14 NOTE — Telephone Encounter (Signed)
Pt has upcoming appt an wants you to view this lab work to see if you think she  Needs any further labs before the appt. On 11/2 for [facility]   Please review an advise

## 2014-09-15 MED ORDER — POTASSIUM CHLORIDE ER 10 MEQ PO TBCR: 10.0000 meq | EXTENDED_RELEASE_TABLET | Freq: Every day | ORAL | Status: DC

## 2014-09-15 NOTE — Telephone Encounter (Signed)
It should be noted that her blood work showed LDL of 194 total cholesterol 291 HDL 81 we will discuss this when she follows up also her creatinine was 1.11 potassium 3.4 and A1c 6.2 a copy of this was to be scanned into the system

## 2014-09-15 NOTE — Telephone Encounter (Signed)
Discussed with patient. Rx sent electronically to pharmacy and blood work ordered in [employer]. Patient verbalized understanding.

## 2014-09-15 NOTE — Telephone Encounter (Signed)
Potassium is low. Start KCl 10 mEq, #30, 5 refills, start one every single day. Recheck metabolic 7 in 2 weeks. Keep appointment in early November. Try to do lab work before this office visit.

## 2014-09-23 ENCOUNTER — Ambulatory Visit (HOSPITAL_COMMUNITY)
Admission: RE | Admit: 2014-09-23 | Discharge: 2014-09-23 | Disposition: A | Discharge status: Home / Self Care | Payer: BC Managed Care – PPO | Source: Ambulatory Visit | Attending: Family Medicine | Admitting: Family Medicine

## 2014-09-23 DIAGNOSIS — Z1231 Encounter for screening mammogram for malignant neoplasm of breast: Secondary | ICD-10-CM | POA: Insufficient documentation

## 2014-09-27 ENCOUNTER — Ambulatory Visit (INDEPENDENT_AMBULATORY_CARE_PROVIDER_SITE_OTHER): Payer: BC Managed Care – PPO | Admitting: *Deleted

## 2014-09-27 ENCOUNTER — Ambulatory Visit (INDEPENDENT_AMBULATORY_CARE_PROVIDER_SITE_OTHER): Payer: BC Managed Care – PPO | Admitting: Family Medicine

## 2014-09-27 ENCOUNTER — Encounter: Payer: Self-pay | Admitting: Family Medicine

## 2014-09-27 VITALS — BP 110/68

## 2014-09-27 DIAGNOSIS — E785 Hyperlipidemia, unspecified: Secondary | ICD-10-CM

## 2014-09-27 DIAGNOSIS — I10 Essential (primary) hypertension: Secondary | ICD-10-CM

## 2014-09-27 DIAGNOSIS — M25571 Pain in right ankle and joints of right foot: Secondary | ICD-10-CM

## 2014-09-27 DIAGNOSIS — Z23 Encounter for immunization: Secondary | ICD-10-CM

## 2014-09-27 DIAGNOSIS — M25572 Pain in left ankle and joints of left foot: Secondary | ICD-10-CM

## 2014-09-27 DIAGNOSIS — R7303 Prediabetes: Secondary | ICD-10-CM

## 2014-09-27 DIAGNOSIS — R7309 Other abnormal glucose: Secondary | ICD-10-CM

## 2014-09-27 NOTE — Patient Instructions (Signed)
Diabetes Mellitus and Food It is important for you to manage your blood sugar (glucose) level. Your blood glucose level can be greatly affected by what you eat. Eating healthier foods in the appropriate amounts throughout the day at about the same time each day will help you control your blood glucose level. It can also help slow or prevent worsening of your diabetes mellitus. Healthy eating may even help you improve the level of your blood pressure and reach or maintain a healthy weight.  HOW CAN FOOD AFFECT ME? Carbohydrates Carbohydrates affect your blood glucose level more than any other type of food. Your dietitian will help you determine how many carbohydrates to eat at each meal and teach you how to count carbohydrates. Counting carbohydrates is important to keep your blood glucose at a healthy level, especially if you are using insulin or taking certain medicines for diabetes mellitus. Alcohol Alcohol can cause sudden decreases in blood glucose (hypoglycemia), especially if you use insulin or take certain medicines for diabetes mellitus. Hypoglycemia can be a life-threatening condition. Symptoms of hypoglycemia (sleepiness, dizziness, and disorientation) are similar to symptoms of having too much alcohol.  If your health care provider has given you approval to drink alcohol, do so in moderation and use the following guidelines:  Women should not have more than one drink per day, and men should not have more than two drinks per day. One drink is equal to:  12 oz of beer.  5 oz of wine.  1 oz of hard liquor.  Do not drink on an empty stomach.  Keep yourself hydrated. Have water, diet soda, or unsweetened iced tea.  Regular soda, juice, and other mixers might contain a lot of carbohydrates and should be counted. WHAT FOODS ARE NOT RECOMMENDED? As you make food choices, it is important to remember that all foods are not the same. Some foods have fewer nutrients per serving than other  foods, even though they might have the same number of calories or carbohydrates. It is difficult to get your body what it needs when you eat foods with fewer nutrients. Examples of foods that you should avoid that are high in calories and carbohydrates but low in nutrients include:  Trans fats (most processed foods list trans fats on the Nutrition Facts label).  Regular soda.  Juice.  Candy.  Sweets, such as cake, pie, doughnuts, and cookies.  Fried foods. WHAT FOODS CAN I EAT? Have nutrient-rich foods, which will nourish your body and keep you healthy. The food you should eat also will depend on several factors, including:  The calories you need.  The medicines you take.  Your weight.  Your blood glucose level.  Your blood pressure level.  Your cholesterol level. You also should eat a variety of foods, including:  Protein, such as meat, poultry, fish, tofu, nuts, and seeds (lean animal proteins are best).  Fruits.  Vegetables.  Dairy products, such as milk, cheese, and yogurt (low fat is best).  Breads, grains, pasta, cereal, rice, and beans.  Fats such as olive oil, trans fat-free margarine, canola oil, avocado, and olives. DOES EVERYONE WITH DIABETES MELLITUS HAVE THE SAME MEAL PLAN? Because every person with diabetes mellitus is different, there is not one meal plan that works for everyone. It is very important that you meet with a dietitian who will help you create a meal plan that is just right for you. Document Released: 08/09/2005 Document Revised: 11/17/2013 Document Reviewed: 10/09/2013 ExitCare Patient Information 2015 [employer]. This   information is not intended to replace advice given to you by your health care provider. Make sure you discuss any questions you have with your health care provider.  

## 2014-09-27 NOTE — Progress Notes (Signed)
   Subjective:    Patient ID: [name], female    DOB: 12/22/1960, 53 y.o.   MRN: [id number]  HPI The patient comes in today for Hyptertension check up.  A review of their health history was completed.  A review of medications was also completed. Any needed refills; yes on all meds.  Eats healthy.  Follow up on bloodwork. Pt brought in bloodwork a few weeks ago. bloodwork was done about 6 months ago per pt. See telephone. Note pt was suppose to get met 7 done before office visit. Pt didn't understand and did not go.  Long discussion held regarding blood pressure it's under good control today she takes her medicine as directed  She does try to watch fats in her diet  She does not always watch his starches in her diet as well as she should she is going to increase her activity she does have family history of heart disease cholesterol hypertension and diabetes.  Gets physical done at gyn.   Additional concerns: Right ankle pain. Started about 1 year ago. Pain off and on but getting worse.   Flu vaccine today.   Review of Systems  Constitutional: Negative for activity change, appetite change and fatigue.  Respiratory: Negative for cough and shortness of breath.   Cardiovascular: Negative for chest pain.  Gastrointestinal: Negative for abdominal pain.  Endocrine: Negative for polydipsia and polyphagia.  Genitourinary: Negative for frequency.  Neurological: Negative for weakness.  Psychiatric/Behavioral: Negative for confusion.  complains of subjective arthralgias in her ankles     Objective:   Physical Exam  Constitutional: She appears well-nourished. No distress.  Cardiovascular: Normal rate, regular rhythm and normal heart sounds.   No murmur heard. Pulmonary/Chest: Effort normal and breath sounds normal. No respiratory distress.  Musculoskeletal: She exhibits no edema.  Lymphadenopathy:    She has no cervical adenopathy.  Neurological: She is alert. She exhibits normal  muscle tone.  Psychiatric: Her behavior is normal.  Vitals reviewed.   Ankles have full range of motion      Assessment & Plan:  Ankle arthralgia if worsens may need orthopedic consult doubt rheumatoid arthritis patient has history of being a dancer could well be overuse from the past  Prediabetes watch sugar in diet closely  Hyperlipidemia LDL elevated HDL very good risk of heart disease based on current numbers and the American Academy a cardiology's guidelines 2.5%. Does not need to be on statin.  HTN good control  History of low potassium check metabolic 7 and hemoglobin A1c If her A1c is elevated she may need to be on statins per those guidelines alone

## 2014-10-04 ENCOUNTER — Other Ambulatory Visit: Payer: Self-pay | Admitting: Family Medicine

## 2014-12-09 ENCOUNTER — Ambulatory Visit: Payer: Self-pay | Admitting: Family Medicine

## 2014-12-17 ENCOUNTER — Ambulatory Visit: Payer: Self-pay | Admitting: Family Medicine

## 2015-02-10 ENCOUNTER — Ambulatory Visit (INDEPENDENT_AMBULATORY_CARE_PROVIDER_SITE_OTHER): Payer: BLUE CROSS/BLUE SHIELD | Admitting: Family Medicine

## 2015-02-10 ENCOUNTER — Other Ambulatory Visit: Payer: Self-pay | Admitting: *Deleted

## 2015-02-10 ENCOUNTER — Encounter: Payer: Self-pay | Admitting: Family Medicine

## 2015-02-10 ENCOUNTER — Ambulatory Visit (HOSPITAL_COMMUNITY)
Admission: RE | Admit: 2015-02-10 | Discharge: 2015-02-10 | Disposition: A | Discharge status: Home / Self Care | Payer: BLUE CROSS/BLUE SHIELD | Source: Ambulatory Visit | Attending: Family Medicine | Admitting: Family Medicine

## 2015-02-10 VITALS — BP 132/82 | Ht 67.0 in | Wt 147.2 lb

## 2015-02-10 DIAGNOSIS — M25511 Pain in right shoulder: Secondary | ICD-10-CM

## 2015-02-10 DIAGNOSIS — M25521 Pain in right elbow: Secondary | ICD-10-CM | POA: Diagnosis not present

## 2015-02-10 DIAGNOSIS — S52021A Displaced fracture of olecranon process without intraarticular extension of right ulna, initial encounter for closed fracture: Secondary | ICD-10-CM

## 2015-02-10 DIAGNOSIS — W109XXA Fall (on) (from) unspecified stairs and steps, initial encounter: Secondary | ICD-10-CM | POA: Diagnosis not present

## 2015-02-10 DIAGNOSIS — S52031A Displaced fracture of olecranon process with intraarticular extension of right ulna, initial encounter for closed fracture: Secondary | ICD-10-CM | POA: Insufficient documentation

## 2015-02-10 MED ORDER — HYDROCODONE-ACETAMINOPHEN 5-325 MG PO TABS: 1.0000 | ORAL_TABLET | Freq: Four times a day (QID) | ORAL | Status: DC | PRN

## 2015-02-10 NOTE — Progress Notes (Signed)
   Subjective:    Patient ID: [name], female    DOB: 05/23/1961, 54 y.o.   MRN: [id number]  HPI Patient arrives after falling down last 3 steps this am- Patient having pain in right arm from elbow to wrist.  Patient struck elbow rather hard. Now having difficulty with moving it.  No history of injury of this arm before.  Some shoulder pain 2.  Tics meds filled did not seem to help much.  No numbness or tingling.  [place] hard wooden steps. Was not able to recur fall Review of Systems right arm pain   No neck pain no headache no chest pain    Objective:   Physical Exam  Alert vital stable lungs clear heart regular in rhythm lateral elbow distinctly tender to palpation. Substantial difficulty with extension. Shoulder decent range of motion wrists good range of motion      Assessment & Plan:  Impression contusion with concern for fracture sent for emergency x-rays. X-rays performed I spoke with radiation tech. I then spoke with the patient regarding her results. I spoke with our nurse working on referral to appropriate specialist 25 minutes spent on all of this most in discussion plan with all occurring and fracture needs urgent orthopedic referral. Patient to wear spring. Using anti-inflammatory and hydrocodone when necessary. Orthotopic physician preferences discussed with patient

## 2015-02-11 ENCOUNTER — Encounter (HOSPITAL_BASED_OUTPATIENT_CLINIC_OR_DEPARTMENT_OTHER): Payer: Self-pay | Admitting: *Deleted

## 2015-02-11 ENCOUNTER — Encounter (HOSPITAL_COMMUNITY)
Admission: RE | Admit: 2015-02-11 | Discharge: 2015-02-11 | Disposition: A | Discharge status: Home / Self Care | Payer: BLUE CROSS/BLUE SHIELD | Source: Ambulatory Visit | Attending: Orthopedic Surgery | Admitting: Orthopedic Surgery

## 2015-02-11 ENCOUNTER — Other Ambulatory Visit: Payer: Self-pay

## 2015-02-11 ENCOUNTER — Other Ambulatory Visit: Payer: Self-pay | Admitting: Orthopedic Surgery

## 2015-02-11 DIAGNOSIS — X58XXXA Exposure to other specified factors, initial encounter: Secondary | ICD-10-CM | POA: Diagnosis not present

## 2015-02-11 DIAGNOSIS — S52021A Displaced fracture of olecranon process without intraarticular extension of right ulna, initial encounter for closed fracture: Secondary | ICD-10-CM | POA: Insufficient documentation

## 2015-02-11 DIAGNOSIS — Z01812 Encounter for preprocedural laboratory examination: Secondary | ICD-10-CM | POA: Diagnosis present

## 2015-02-11 DIAGNOSIS — Z0182 Encounter for allergy testing: Secondary | ICD-10-CM | POA: Diagnosis not present

## 2015-02-11 LAB — BASIC METABOLIC PANEL
ANION GAP: 6 (ref 5–15)
BUN: 10 mg/dL (ref 6–23)
CO2: 32 mmol/L (ref 19–32)
CREATININE: 0.89 mg/dL (ref 0.50–1.10)
Calcium: 9.2 mg/dL (ref 8.4–10.5)
Chloride: 102 mmol/L (ref 96–112)
GFR, EST AFRICAN AMERICAN: 84 mL/min — AB (ref >90)
GFR, EST NON AFRICAN AMERICAN: 72 mL/min — AB (ref >90)
Glucose, Bld: 89 mg/dL (ref 70–99)
Potassium: 3.3 mmol/L — ABNORMAL LOW (ref 3.5–5.1)
Sodium: 140 mmol/L (ref 135–145)

## 2015-02-11 NOTE — Progress Notes (Signed)
To go to [facility] for [id number]-bmet

## 2015-02-15 ENCOUNTER — Encounter (HOSPITAL_BASED_OUTPATIENT_CLINIC_OR_DEPARTMENT_OTHER)
Admission: RE | Disposition: A | Discharge status: Home / Self Care | Payer: Self-pay | Source: Ambulatory Visit | Attending: Orthopedic Surgery

## 2015-02-15 ENCOUNTER — Ambulatory Visit (HOSPITAL_BASED_OUTPATIENT_CLINIC_OR_DEPARTMENT_OTHER): Payer: BLUE CROSS/BLUE SHIELD | Admitting: Anesthesiology

## 2015-02-15 ENCOUNTER — Ambulatory Visit (HOSPITAL_BASED_OUTPATIENT_CLINIC_OR_DEPARTMENT_OTHER)
Admission: RE | Admit: 2015-02-15 | Discharge: 2015-02-15 | Disposition: A | Discharge status: Home / Self Care | Payer: BLUE CROSS/BLUE SHIELD | Source: Ambulatory Visit | Attending: Orthopedic Surgery | Admitting: Orthopedic Surgery

## 2015-02-15 ENCOUNTER — Encounter (HOSPITAL_BASED_OUTPATIENT_CLINIC_OR_DEPARTMENT_OTHER): Payer: Self-pay

## 2015-02-15 DIAGNOSIS — Y929 Unspecified place or not applicable: Secondary | ICD-10-CM | POA: Insufficient documentation

## 2015-02-15 DIAGNOSIS — W109XXA Fall (on) (from) unspecified stairs and steps, initial encounter: Secondary | ICD-10-CM | POA: Insufficient documentation

## 2015-02-15 DIAGNOSIS — Y999 Unspecified external cause status: Secondary | ICD-10-CM | POA: Insufficient documentation

## 2015-02-15 DIAGNOSIS — I10 Essential (primary) hypertension: Secondary | ICD-10-CM | POA: Diagnosis not present

## 2015-02-15 DIAGNOSIS — Y939 Activity, unspecified: Secondary | ICD-10-CM | POA: Insufficient documentation

## 2015-02-15 DIAGNOSIS — Z79899 Other long term (current) drug therapy: Secondary | ICD-10-CM | POA: Insufficient documentation

## 2015-02-15 DIAGNOSIS — S52021A Displaced fracture of olecranon process without intraarticular extension of right ulna, initial encounter for closed fracture: Secondary | ICD-10-CM | POA: Diagnosis not present

## 2015-02-15 HISTORY — PX: ORIF ELBOW FRACTURE: SHX5031

## 2015-02-15 LAB — POCT HEMOGLOBIN-HEMACUE: Hemoglobin: 14.2 g/dL (ref 12.0–15.0)

## 2015-02-15 SURGERY — OPEN REDUCTION INTERNAL FIXATION (ORIF) ELBOW/OLECRANON FRACTURE
Anesthesia: Regional | Site: Elbow | Laterality: Right

## 2015-02-15 MED ORDER — OXYCODONE HCL 5 MG PO TABS: 5.0000 mg | ORAL_TABLET | Freq: Once | ORAL | Status: DC | PRN

## 2015-02-15 MED ORDER — PROPOFOL 10 MG/ML IV BOLUS
INTRAVENOUS | Status: DC | PRN
  Administered 2015-02-15: 200 mg via INTRAVENOUS

## 2015-02-15 MED ORDER — OXYCODONE-ACETAMINOPHEN 5-325 MG PO TABS: ORAL_TABLET | ORAL | Status: DC

## 2015-02-15 MED ORDER — CEFAZOLIN SODIUM-DEXTROSE 2-3 GM-% IV SOLR
2.0000 g | INTRAVENOUS | Status: AC
  Administered 2015-02-15: 2 g via INTRAVENOUS

## 2015-02-15 MED ORDER — OXYCODONE HCL 5 MG/5ML PO SOLN: 5.0000 mg | Freq: Once | ORAL | Status: DC | PRN

## 2015-02-15 MED ORDER — MIDAZOLAM HCL 2 MG/2ML IJ SOLN
INTRAMUSCULAR | Status: AC
  Filled 2015-02-15: qty 2

## 2015-02-15 MED ORDER — BUPIVACAINE-EPINEPHRINE (PF) 0.5% -1:200000 IJ SOLN
INTRAMUSCULAR | Status: DC | PRN
  Administered 2015-02-15: 25 mL via PERINEURAL

## 2015-02-15 MED ORDER — DEXAMETHASONE SODIUM PHOSPHATE 10 MG/ML IJ SOLN
INTRAMUSCULAR | Status: DC | PRN
  Administered 2015-02-15: 10 mg via INTRAVENOUS

## 2015-02-15 MED ORDER — EPHEDRINE SULFATE 50 MG/ML IJ SOLN
INTRAMUSCULAR | Status: DC | PRN
  Administered 2015-02-15: 10 mg via INTRAVENOUS

## 2015-02-15 MED ORDER — PROMETHAZINE HCL 25 MG/ML IJ SOLN: 6.2500 mg | INTRAMUSCULAR | Status: DC | PRN

## 2015-02-15 MED ORDER — CHLORHEXIDINE GLUCONATE 4 % EX LIQD: 60.0000 mL | Freq: Once | CUTANEOUS | Status: DC

## 2015-02-15 MED ORDER — MIDAZOLAM HCL 5 MG/5ML IJ SOLN
INTRAMUSCULAR | Status: DC | PRN
  Administered 2015-02-15: 1 mg via INTRAVENOUS

## 2015-02-15 MED ORDER — HYDROMORPHONE HCL 1 MG/ML IJ SOLN: 0.2500 mg | INTRAMUSCULAR | Status: DC | PRN

## 2015-02-15 MED ORDER — MIDAZOLAM HCL 2 MG/2ML IJ SOLN
1.0000 mg | INTRAMUSCULAR | Status: DC | PRN
  Administered 2015-02-15: 2 mg via INTRAVENOUS

## 2015-02-15 MED ORDER — ONDANSETRON HCL 4 MG/2ML IJ SOLN
INTRAMUSCULAR | Status: DC | PRN
  Administered 2015-02-15: 4 mg via INTRAVENOUS

## 2015-02-15 MED ORDER — FENTANYL CITRATE 0.05 MG/ML IJ SOLN
50.0000 ug | INTRAMUSCULAR | Status: DC | PRN
  Administered 2015-02-15: 100 ug via INTRAVENOUS

## 2015-02-15 MED ORDER — FENTANYL CITRATE 0.05 MG/ML IJ SOLN
INTRAMUSCULAR | Status: AC
  Filled 2015-02-15: qty 6

## 2015-02-15 MED ORDER — LACTATED RINGERS IV SOLN
INTRAVENOUS | Status: DC
  Administered 2015-02-15 (×2): via INTRAVENOUS

## 2015-02-15 MED ORDER — FENTANYL CITRATE 0.05 MG/ML IJ SOLN
INTRAMUSCULAR | Status: AC
  Filled 2015-02-15: qty 2

## 2015-02-15 SURGICAL SUPPLY — 74 items
APL SKNCLS STERI-STRIP NONHPOA (GAUZE/BANDAGES/DRESSINGS) ×1
BANDAGE ELASTIC 3 VELCRO ST LF (GAUZE/BANDAGES/DRESSINGS) ×4 IMPLANT
BENZOIN TINCTURE PRP APPL 2/3 (GAUZE/BANDAGES/DRESSINGS) ×2 IMPLANT
BIT DRILL 2.5X2.75 QC CALB (BIT) ×2 IMPLANT
BIT DRILL CALIBRATED 2.7 (BIT) ×2 IMPLANT
BLADE MINI RND TIP GREEN BEAV (BLADE) IMPLANT
BLADE SURG 15 STRL LF DISP TIS (BLADE) ×2 IMPLANT
BLADE SURG 15 STRL SS (BLADE) ×4
BNDG CMPR 9X4 STRL LF SNTH (GAUZE/BANDAGES/DRESSINGS) ×1
BNDG ESMARK 4X9 LF (GAUZE/BANDAGES/DRESSINGS) ×2 IMPLANT
BNDG GAUZE ELAST 4 BULKY (GAUZE/BANDAGES/DRESSINGS) ×2 IMPLANT
CHLORAPREP W/TINT 26ML (MISCELLANEOUS) ×2 IMPLANT
CORDS BIPOLAR (ELECTRODE) ×2 IMPLANT
COVER BACK TABLE 60X90IN (DRAPES) ×2 IMPLANT
COVER MAYO STAND STRL (DRAPES) ×2 IMPLANT
CUFF TOURN SGL LL 18 NRW (TOURNIQUET CUFF) ×2 IMPLANT
DRAPE EXTREMITY T 121X128X90 (DRAPE) ×2 IMPLANT
DRAPE OEC MINIVIEW 54X84 (DRAPES) ×2 IMPLANT
DRAPE SURG 17X23 STRL (DRAPES) ×2 IMPLANT
DRSG PAD ABDOMINAL 8X10 ST (GAUZE/BANDAGES/DRESSINGS) ×2 IMPLANT
GAUZE SPONGE 4X4 12PLY STRL (GAUZE/BANDAGES/DRESSINGS) ×2 IMPLANT
GAUZE XEROFORM 1X8 LF (GAUZE/BANDAGES/DRESSINGS) ×2 IMPLANT
GLOVE BIO SURGEON STRL SZ7.5 (GLOVE) ×2 IMPLANT
GLOVE BIOGEL PI IND STRL 7.0 (GLOVE) ×1 IMPLANT
GLOVE BIOGEL PI IND STRL 7.5 (GLOVE) ×1 IMPLANT
GLOVE BIOGEL PI IND STRL 8 (GLOVE) ×1 IMPLANT
GLOVE BIOGEL PI IND STRL 8.5 (GLOVE) ×1 IMPLANT
GLOVE BIOGEL PI INDICATOR 7.0 (GLOVE) ×1
GLOVE BIOGEL PI INDICATOR 7.5 (GLOVE) ×1
GLOVE BIOGEL PI INDICATOR 8 (GLOVE) ×1
GLOVE BIOGEL PI INDICATOR 8.5 (GLOVE) ×1
GLOVE SURG ORTHO 8.0 STRL STRW (GLOVE) ×2 IMPLANT
GLOVE SURG SS PI 7.5 STRL IVOR (GLOVE) ×2 IMPLANT
GOWN STRL REUS W/ TWL LRG LVL3 (GOWN DISPOSABLE) ×1 IMPLANT
GOWN STRL REUS W/TWL LRG LVL3 (GOWN DISPOSABLE) ×2
GOWN STRL REUS W/TWL XL LVL3 (GOWN DISPOSABLE) ×4 IMPLANT
K-WIRE ACE 1.6X6 (WIRE) ×2
KWIRE ACE 1.6X6 (WIRE) ×1 IMPLANT
NEEDLE HYPO 25X1 1.5 SAFETY (NEEDLE) IMPLANT
PACK BASIN DAY SURGERY FS (CUSTOM PROCEDURE TRAY) ×2 IMPLANT
PAD CAST 3X4 CTTN HI CHSV (CAST SUPPLIES) IMPLANT
PAD CAST 4YDX4 CTTN HI CHSV (CAST SUPPLIES) ×1 IMPLANT
PADDING CAST ABS 4INX4YD NS (CAST SUPPLIES)
PADDING CAST ABS COTTON 4X4 ST (CAST SUPPLIES) IMPLANT
PADDING CAST COTTON 3X4 STRL (CAST SUPPLIES)
PADDING CAST COTTON 4X4 STRL (CAST SUPPLIES) ×2
PLATE OLECRANON SM (Plate) ×2 IMPLANT
SCREW CORTICAL LOW PROF 3.5X20 (Screw) ×2 IMPLANT
SCREW LOCK CORT STAR 3.5X14 (Screw) ×2 IMPLANT
SCREW LOCK CORT STAR 3.5X18 (Screw) ×2 IMPLANT
SCREW LOCK CORT STAR 3.5X38 (Screw) ×2 IMPLANT
SCREW LOW PROFILE 18MMX3.5MM (Screw) ×6 IMPLANT
SCREW LOW PROFILE 22MMX3.5MM (Screw) ×2 IMPLANT
SLEEVE SCD COMPRESS KNEE MED (MISCELLANEOUS) ×2 IMPLANT
SLING ARM MED ADULT FOAM STRAP (SOFTGOODS) ×2 IMPLANT
SPLINT PLASTER CAST XFAST 3X15 (CAST SUPPLIES) ×30 IMPLANT
SPLINT PLASTER XTRA FASTSET 3X (CAST SUPPLIES) ×30
SPONGE LAP 4X18 X RAY DECT (DISPOSABLE) ×2 IMPLANT
STOCKINETTE 4X48 STRL (DRAPES) ×2 IMPLANT
STRIP CLOSURE SKIN 1/2X4 (GAUZE/BANDAGES/DRESSINGS) ×2 IMPLANT
SUCTION FRAZIER TIP 10 FR DISP (SUCTIONS) IMPLANT
SUT ETHILON 3 0 PS 1 (SUTURE) IMPLANT
SUT ETHILON 4 0 PS 2 18 (SUTURE) IMPLANT
SUT MNCRL AB 3-0 PS2 18 (SUTURE) IMPLANT
SUT MNCRL AB 4-0 PS2 18 (SUTURE) ×2 IMPLANT
SUT VIC AB 3-0 PS1 18 (SUTURE) ×2
SUT VIC AB 3-0 PS1 18XBRD (SUTURE) ×1 IMPLANT
SUT VICRYL 4-0 PS2 18IN ABS (SUTURE) ×2 IMPLANT
SYR BULB 3OZ (MISCELLANEOUS) ×2 IMPLANT
SYR CONTROL 10ML LL (SYRINGE) IMPLANT
TOWEL OR 17X24 6PK STRL BLUE (TOWEL DISPOSABLE) ×4 IMPLANT
TUBE CONNECTING 20X1/4 (TUBING) IMPLANT
UNDERPAD 30X30 INCONTINENT (UNDERPADS AND DIAPERS) ×2 IMPLANT
WASHER 3.5MM (Orthopedic Implant) ×2 IMPLANT

## 2015-02-15 NOTE — Anesthesia Preprocedure Evaluation (Addendum)
Anesthesia Evaluation  Patient identified by MRN, date of birth, ID band Patient awake    Reviewed: Allergy & Precautions, NPO status , Patient's Chart, lab work & pertinent test results  Airway Mallampati: I  TM Distance: >3 FB Neck ROM: Full    Dental  (+) Teeth Intact, Dental Advisory Given   Pulmonary neg pulmonary ROS,  breath sounds clear to auscultation        Cardiovascular hypertension, Pt. on medications Rhythm:Regular Rate:Normal     Neuro/Psych negative neurological ROS     GI/Hepatic negative GI ROS, Neg liver ROS,   Endo/Other  negative endocrine ROS  Renal/GU negative Renal ROS     Musculoskeletal   Abdominal   Peds  Hematology negative hematology ROS (+)   Anesthesia Other Findings   Reproductive/Obstetrics                            Anesthesia Physical Anesthesia Plan  ASA: II  Anesthesia Plan: General and Regional   Post-op Pain Management:    Induction: Intravenous  Airway Management Planned: LMA and Oral ETT  Additional Equipment:   Intra-op Plan:   Post-operative Plan: Extubation in OR  Informed Consent: I have reviewed the patients History and Physical, chart, labs and discussed the procedure including the risks, benefits and alternatives for the proposed anesthesia with the patient or authorized representative who has indicated his/her understanding and acceptance.   Dental advisory given  Plan Discussed with: CRNA  Anesthesia Plan Comments:        Anesthesia Quick Evaluation

## 2015-02-15 NOTE — Op Note (Signed)
[id number] 

## 2015-02-15 NOTE — Anesthesia Postprocedure Evaluation (Signed)
  Anesthesia Post-op Note  Patient: [name]  Procedure(s) Performed: Procedure(s): OPEN REDUCTION INTERNAL FIXATION (ORIF) RIGHT OLECRANON FRACTURE (Right)  Patient Location: PACU  Anesthesia Type:GA combined with regional for post-op pain  Level of Consciousness: awake and alert   Airway and Oxygen Therapy: Patient Spontanous Breathing  Post-op Pain: none  Post-op Assessment: Post-op Vital signs reviewed  Post-op Vital Signs: Reviewed  Last Vitals:  Filed Vitals:   02/15/15 1300  BP: 124/80  Pulse: 93  Temp:   Resp: 12    Complications: No apparent anesthesia complications

## 2015-02-15 NOTE — H&P (Signed)
  [name] is an 54 y.o. female.   Chief Complaint: right olecranon fracture HPI: 54 yo rhd female states she fell going down stairs 02/10/15 injuring right elbow.  XR at pcp revealed right olecranon fracture.  Referred for further care.  She reports no previous injury to right elbow and no other injury at this time.  She wishes to have surgical fixation of the fracture.  Past Medical History  Diagnosis Date  . Hypertension   . Fibroids   . Fibroids     H/O  . Anemia   . Herpes simplex without mention of complication     HSV 2  . H/O menorrhagia   . Hyperlipidemia   . WBC decreased 2009  . Viral meningitis 06/2008    Past Surgical History  Procedure Laterality Date  . Cesarean section  1992 &1998  . Wisdom tooth extraction    . Colonoscopy  09/2012    Family History  Problem Relation Age of Onset  . Colon cancer Mother   . Lung disease Mother   . Diabetes Mother   . Hypertension Mother   . Diabetes Father   . Hypertension Brother   . Hypertension Sister   . Hypertension Brother    Social History:  reports that she has never smoked. She has never used smokeless tobacco. She reports that she drinks about 0.6 oz of alcohol per week. She reports that she does not use illicit drugs.  Allergies: No Known Allergies  No prescriptions prior to admission    No results found for this or any previous visit (from the past 48 hour(s)).  No results found.   A comprehensive review of systems was negative.  Height 5\' 7"  (1.702 m), weight 66.679 kg (147 lb), last menstrual period 11/25/2012.  General appearance: alert, cooperative and appears stated age Head: Normocephalic, without obvious abnormality, atraumatic Neck: supple, symmetrical, trachea midline Resp: clear to auscultation bilaterally Cardio: regular rate and rhythm GI: non tender Extremities: intact sensation and capillary refill all digits.  +epl/fpl/io.  no wounds. Pulses: 2+ and symmetric Skin: Skin  color, texture, turgor normal. No rashes or lesions Neurologic: Grossly normal Incision/Wound: none  Assessment/Plan Right olecranon fracture.  Non operative and operative treatment options were discussed with the patient and patient wishes to proceed with operative treatment. Risks, benefits, and alternatives of surgery were discussed and the patient agrees with the plan of care.   Osamu Olguin R 02/15/2015, 8:39 AM

## 2015-02-15 NOTE — Progress Notes (Signed)
Assisted Dr. Rob Fitzgerald with right, ultrasound guided, supraclavicular block. Side rails up, monitors on throughout procedure. See vital signs in flow sheet. Tolerated Procedure well. 

## 2015-02-15 NOTE — Brief Op Note (Signed)
02/15/2015  12:31 PM  PATIENT:  Salvatore Marvel  54 y.o. female  PRE-OPERATIVE DIAGNOSIS:  RIGHT OLECRANON FRACTURE   POST-OPERATIVE DIAGNOSIS:  Right Olecranon Fracture  PROCEDURE:  Procedure(s): OPEN REDUCTION INTERNAL FIXATION (ORIF) RIGHT OLECRANON FRACTURE (Right)  SURGEON:  Surgeon(s) and Role:    * Leanora Cover, MD - Primary    * Daryll Brod, MD - Assisting  PHYSICIAN ASSISTANT:   ASSISTANTS: Daryll Brod, MD   ANESTHESIA:   regional and general  EBL:  Total I/O In: 1600 [I.V.:1600] Out: -   BLOOD ADMINISTERED:none  DRAINS: none   LOCAL MEDICATIONS USED:  NONE  SPECIMEN:  No Specimen  DISPOSITION OF SPECIMEN:  N/A  COUNTS:  YES  TOURNIQUET:   Total Tourniquet Time Documented: Upper Arm (Right) - 64 minutes Total: Upper Arm (Right) - 64 minutes   DICTATION: .Other Dictation: Dictation Number [id number]  PLAN OF CARE: Discharge to home after PACU  PATIENT DISPOSITION:  PACU - hemodynamically stable.

## 2015-02-15 NOTE — Transfer of Care (Signed)
Immediate Anesthesia Transfer of Care Note  Patient: [name]  Procedure(s) Performed: Procedure(s): OPEN REDUCTION INTERNAL FIXATION (ORIF) RIGHT OLECRANON FRACTURE (Right)  Patient Location: PACU  Anesthesia Type:GA combined with regional for post-op pain  Level of Consciousness: awake and patient cooperative  Airway & Oxygen Therapy: Patient Spontanous Breathing and Patient connected to face mask oxygen  Post-op Assessment: Report given to RN and Post -op Vital signs reviewed and stable  Post vital signs: Reviewed and stable  Last Vitals:  Filed Vitals:   02/15/15 1004  BP:   Pulse: 83  Temp:   Resp: 12    Complications: No apparent anesthesia complications

## 2015-02-15 NOTE — Anesthesia Procedure Notes (Addendum)
Anesthesia Regional Block:  Supraclavicular block  Pre-Anesthetic Checklist: ,, timeout performed, Correct Patient, Correct Site, Correct Laterality, Correct Procedure, Correct Position, site marked, Risks and benefits discussed,  Surgical consent,  Pre-op evaluation,  At surgeon's request and post-op pain management   Prep: chloraprep       Needles:  Injection technique: Single-shot  Needle Type: Echogenic Stimulator Needle     Needle Length: 9cm 9 cm Needle Gauge: 21 and 21 G    Additional Needles:  Procedures: ultrasound guided (picture in chart) and nerve stimulator Supraclavicular block  Nerve Stimulator or Paresthesia:  Response: radial, 0.5 mA,   Additional Responses:   Narrative:  Start time: 02/15/2015 9:40 AM End time: 02/15/2015 9:52 AM Injection made incrementally with aspirations every 5 mL.  Performed by: Personally  Anesthesiologist: Suzette Battiest  Additional Notes: Risks, benefits and alternative to block explained extensively.  Patient tolerated procedure well, without complications.   Procedure Name: LMA Insertion Date/Time: 02/15/2015 11:05 AM Performed by: Leanora Cover Pre-anesthesia Checklist: Patient identified, Emergency Drugs available, Suction available and Patient being monitored Patient Re-evaluated:Patient Re-evaluated prior to inductionOxygen Delivery Method: Circle System Utilized Preoxygenation: Pre-oxygenation with 100% oxygen Intubation Type: IV induction Ventilation: Mask ventilation without difficulty LMA: LMA inserted LMA Size: 4.0 Number of attempts: 1 Airway Equipment and Method: Bite block Placement Confirmation: positive ETCO2 Tube secured with: Tape Dental Injury: Teeth and Oropharynx as per pre-operative assessment

## 2015-02-15 NOTE — Discharge Instructions (Addendum)
[facility] Instructions °Hand Surgery ° °Wound Care: °Keep your hand elevated above the level of your heart.  Do not allow it to dangle by your side.  Keep the dressing dry and do not remove it unless your doctor advises you to do so.  He will usually change it at the time of your post-op visit.  Moving your fingers is advised to stimulate circulation but will depend on the site of your surgery.  If you have a splint applied, your doctor will advise you regarding movement. ° °Activity: °Do not drive or operate machinery today.  Rest today and then you may return to your normal activity and work as indicated by your physician. ° °Diet:  °Drink liquids today or eat a light diet.  You may resume a regular diet tomorrow.   ° °General expectations: °Pain for two to three days. °Fingers may become slightly swollen. ° °Call your doctor if any of the following occur: °Severe pain not relieved by pain medication. °Elevated temperature. °Dressing soaked with blood. °Inability to move fingers. °White or bluish color to fingers. ° ° °Post Anesthesia Home Care Instructions ° °Activity: °Get plenty of rest for the remainder of the day. A responsible adult should stay with you for 24 hours following the procedure.  °For the next 24 hours, DO NOT: °-Drive a car °-Operate machinery °-Drink alcoholic beverages °-Take any medication unless instructed by your physician °-Make any legal decisions or sign important papers. ° °Meals: °Start with liquid foods such as gelatin or soup. Progress to regular foods as tolerated. Avoid greasy, spicy, heavy foods. If nausea and/or vomiting occur, drink only clear liquids until the nausea and/or vomiting subsides. Call your physician if vomiting continues. ° °Special Instructions/Symptoms: °Your throat may feel dry or sore from the anesthesia or the breathing tube placed in your throat during surgery. If this causes discomfort, gargle with warm salt water. The discomfort should disappear within 24  hours. ° ° °Regional Anesthesia Blocks ° °1. Numbness or the inability to move the "blocked" extremity may last from 3-48 hours after placement. The length of time depends on the medication injected and your individual response to the medication. If the numbness is not going away after 48 hours, call your surgeon. ° °2. The extremity that is blocked will need to be protected until the numbness is gone and the  Strength has returned. Because you cannot feel it, you will need to take extra care to avoid injury. Because it may be weak, you may have difficulty moving it or using it. You may not know what position it is in without looking at it while the block is in effect. ° °3. For blocks in the legs and feet, returning to weight bearing and walking needs to be done carefully. You will need to wait until the numbness is entirely gone and the strength has returned. You should be able to move your leg and foot normally before you try and bear weight or walk. You will need someone to be with you when you first try to ensure you do not fall and possibly risk injury. ° °4. Bruising and tenderness at the needle site are common side effects and will resolve in a few days. ° °5. Persistent numbness or new problems with movement should be communicated to the surgeon or the [facility] ([phone])/ [facility] ([phone]). °

## 2015-02-15 NOTE — Op Note (Signed)
Intra-operative fluoroscopic images in the AP, lateral, and oblique views were taken and evaluated by myself.  Reduction and hardware placement were confirmed.  There was no intraarticular penetration of permanent hardware.  

## 2015-02-16 ENCOUNTER — Encounter (HOSPITAL_BASED_OUTPATIENT_CLINIC_OR_DEPARTMENT_OTHER): Payer: Self-pay | Admitting: Orthopedic Surgery

## 2015-02-16 NOTE — Op Note (Signed)
[name], [name]              ACCOUNT NO.:  [id number]  MEDICAL RECORD NO.:  [id number]  LOCATION:                                 FACILITY:  PHYSICIAN:  Leanora Cover, MD        DATE OF BIRTH:  May 01, 1961  DATE OF PROCEDURE:  02/15/2015 DATE OF DISCHARGE:  02/15/2015                              OPERATIVE REPORT   PREOPERATIVE DIAGNOSIS:  Right olecranon fracture.  POSTOPERATIVE DIAGNOSIS:  Right olecranon fracture.  PROCEDURE:  Open reduction and internal fixation of right intra- articular olecranon fracture.  SURGEON:  Leanora Cover, MD  ASSISTANT:  Daryll Brod, M.D.  ANESTHESIA:  General with regional.  IV FLUIDS:  Per anesthesia flow sheet.  ESTIMATED BLOOD LOSS:  Minimal.  COMPLICATIONS:  None.  SPECIMENS:  None.  TOURNIQUET TIME:  64 minutes.  DISPOSITION:  Stable to PACU.  INDICATIONS:  [name] is a 54 year old female who last week fell and injuring her right elbow.  She was seen her primary care physician's office where radiographs were taken revealing an olecranon fracture. She was referred to me for further care.  We discussed treatment options.  She wished to proceed with operative fixation.  Risks, benefits, and alternatives of the surgery were discussed including risk of blood loss; infection; damage to nerves, vessels, tendons, ligaments, bone; failure of surgery; need for additional surgery; complications with wound healing; continued pain; nonunion; malunion; stiffness.  We also discussed likely need for removal of hardware due to prominence. She voiced understanding and elected to proceed.  OPERATIVE COURSE:  After being identified preoperatively by myself, the patient and I agreed upon the procedure and site of procedure.  Surgical site was marked.  The risks, benefits, and alternatives of the surgery were reviewed and she wished to proceed.  Surgical consent had been signed.  She was given IV Ancef as preoperative antibiotic  prophylaxis. She was transferred to the operating room and placed on the operating room table in supine position with the right upper extremity on an armboard.  General anesthesia was induced by Anesthesiology.  Right upper extremity was prepped and draped in normal sterile orthopedic fashion.  A surgical pause was performed between surgeons, anesthesia, and operating staff, and all were in agreement as to the patient, procedure, and site of procedure.  Tourniquet at the proximal aspect of the extremity was inflated to 250 mmHg after exsanguination of the limb with an Esmarch bandage.  An incision was made at the dorsum of the elbow over the olecranon and proximal aspect of the ulna.  This was carried into the subcutaneous tissues by spreading technique.  The periosteum was sharply incised and elevated.  The fracture site was easily identified.  It was cleared of soft tissue and hematoma interposition.  The fracture was reduced under direct visualization.  An olecranon plate from the [employer] set was selected.  It was placed on the bone and noted to be good fit.  Standard AO drilling and measuring technique was used.  The two proximal most holes were filled with locking screws.  The distal 4 holes were filled with nonlocking screws. Good purchase was obtained.  The home-run screw was placed after  a provisional guidepin placement and adjustment of the position of the screw hole.  Good purchase was obtained.  C-arm was used throughout the case in AP, lateral, and oblique projections to ensure appropriate reduction and position of hardware, which was the case.  There was no intra-articular penetration.  The wound was copiously irrigated with sterile saline.  The periosteum and soft tissues were repaired back over top of the plate using 3-0 Vicryl suture in a running fashion.  The 3-0 Vicryl suture was used in an inverted interrupted fashion in the subcutaneous tissues and skin was closed with  running subcuticular 4-0 Monocryl.  This was augmented with Steri-Strips.  The wound was dressed with sterile 4x4s and wrapped with a Kerlix bandage.  A posterior splint with sidebar was placed and wrapped with Kerlix and Ace bandage.  The tourniquet was deflated at 64 minutes.  Fingertips were pink with brisk capillary refill after deflation of the tourniquet.  Prior to placing the splint, the forearm was placed through a range of motion and noted to be full range of motion without any crepitance.  The operative drapes were broken down.  The patient was awakened from anesthesia safely.  She was transferred back to the stretcher and taken to PACU in stable condition.  I will see her back in the office in 1 week for postoperative followup.  I will give her Percocet 5/325, 1-2 p.o. q.6 hours p.r.n. pain, dispensed #40.     Leanora Cover, MD     KK/MEDQ  D:  02/15/2015  T:  02/16/2015  Job:  [id number]

## 2015-05-18 ENCOUNTER — Other Ambulatory Visit: Payer: Self-pay | Admitting: *Deleted

## 2015-05-18 MED ORDER — AMLODIPINE BESYLATE 5 MG PO TABS: 5.0000 mg | ORAL_TABLET | Freq: Every day | ORAL | Status: DC

## 2015-05-18 MED ORDER — INDAPAMIDE 1.25 MG PO TABS: ORAL_TABLET | ORAL | Status: DC

## 2015-06-24 ENCOUNTER — Other Ambulatory Visit: Payer: Self-pay | Admitting: *Deleted

## 2015-06-24 MED ORDER — INDAPAMIDE 1.25 MG PO TABS: ORAL_TABLET | ORAL | Status: DC

## 2015-06-24 MED ORDER — AMLODIPINE BESYLATE 5 MG PO TABS: 5.0000 mg | ORAL_TABLET | Freq: Every day | ORAL | Status: DC

## 2015-08-26 ENCOUNTER — Encounter: Payer: Self-pay | Admitting: Nurse Practitioner

## 2015-08-26 ENCOUNTER — Ambulatory Visit (INDEPENDENT_AMBULATORY_CARE_PROVIDER_SITE_OTHER): Payer: 59 | Admitting: Nurse Practitioner

## 2015-08-26 VITALS — BP 148/96 | Ht 67.0 in | Wt 141.2 lb

## 2015-08-26 DIAGNOSIS — F419 Anxiety disorder, unspecified: Secondary | ICD-10-CM

## 2015-08-26 DIAGNOSIS — L819 Disorder of pigmentation, unspecified: Secondary | ICD-10-CM | POA: Diagnosis not present

## 2015-08-26 DIAGNOSIS — F411 Generalized anxiety disorder: Secondary | ICD-10-CM

## 2015-08-26 DIAGNOSIS — F430 Acute stress reaction: Secondary | ICD-10-CM

## 2015-08-26 MED ORDER — TRIAMCINOLONE ACETONIDE 0.1 % EX CREA: 1.0000 "application " | TOPICAL_CREAM | Freq: Two times a day (BID) | CUTANEOUS | Status: DC

## 2015-08-26 MED ORDER — ALPRAZOLAM 0.5 MG PO TABS: ORAL_TABLET | ORAL | Status: DC

## 2015-08-27 ENCOUNTER — Encounter: Payer: Self-pay | Admitting: Nurse Practitioner

## 2015-08-27 DIAGNOSIS — F430 Acute stress reaction: Secondary | ICD-10-CM

## 2015-08-27 DIAGNOSIS — F411 Generalized anxiety disorder: Secondary | ICD-10-CM | POA: Insufficient documentation

## 2015-08-27 NOTE — Progress Notes (Signed)
Subjective:  Presents for complaints of a slight discoloration under her nose that began about 6 weeks ago. Has slightly spread. Nonpruritic. Nontender. No new facial products. Continues to have a great deal of anxiety. Is separated from her husband of over 61 years. Weaned herself off her Paxil 2 days ago. Seems to be doing very well. Uses an occasional Xanax only for extreme anxiety/panic attack. Defers other daily medication at this point. Excellent support system.  Objective:   BP 148/96 mmHg  Ht 5\' 7"  (1.702 m)  Wt 141 lb 4 oz (64.071 kg)  BMI 22.12 kg/m2  LMP 11/25/2012 NAD. Alert, oriented. Calm affect. Thoughts logical coherent and relevant. Dressed appropriately. Lungs clear. Heart regular rate rhythm. Small faint hypopigmented area noted just beneath both nostrils and slightly into the nasolabial fold. No dry skin noted. No lesions noted.  Assessment:  Problem List Items Addressed This Visit      Other   Anxiety as acute reaction to exceptional stress   Relevant Medications   ALPRAZolam (XANAX) 0.5 MG tablet    Other Visit Diagnoses    Hypopigmentation nonspecific    -  Primary      Plan:  Meds ordered this encounter  Medications  . ALPRAZolam (XANAX) 0.5 MG tablet    Sig: 1/2-1 po BID prn anxiety    Dispense:  30 tablet    Refill:  2    Order Specific Question:  Supervising Provider    Answer:  Mikey Kirschner [[id number]]  . triamcinolone cream (KENALOG) 0.1 %    Sig: Apply 1 application topically 2 (two) times daily. Prn rash; use up to 2 weeks    Dispense:  30 g    Refill:  0    Order Specific Question:  Supervising Provider    Answer:  Mikey Kirschner [[id number]]   Continue Xanax on a when necessary basis. If patient finds she is taking it every day, recommend reconsidering daily medication. Use a small amount of triamcinolone cream to affected area no more than 2 weeks. Explained to patient at this point it is very nonspecific. Call back if worsens or persists.

## 2015-08-29 ENCOUNTER — Telehealth: Payer: Self-pay | Admitting: Family Medicine

## 2015-08-29 NOTE — Telephone Encounter (Signed)
Pt called stating that she accidentally threw her alprazolam script away and needs another one.

## 2015-08-30 ENCOUNTER — Other Ambulatory Visit: Payer: Self-pay | Admitting: Nurse Practitioner

## 2015-08-30 MED ORDER — ALPRAZOLAM 0.5 MG PO TABS: ORAL_TABLET | ORAL | Status: DC

## 2015-08-30 NOTE — Telephone Encounter (Signed)
Please print script and have Scott sign. Thanks.

## 2015-08-30 NOTE — Telephone Encounter (Signed)
Called patient and informed her per Pearson Forster, NP that a new prescription for xanax will be faxed over to [facility]. Patient verbalized understanding.

## 2015-09-12 ENCOUNTER — Telehealth: Payer: Self-pay | Admitting: Family Medicine

## 2015-09-12 NOTE — Telephone Encounter (Signed)
Last Chronic visit November 2015 may I refill?

## 2015-09-12 NOTE — Telephone Encounter (Signed)
Pt is needing refills on her following medications: amLODipine (NORVASC) 5 MG tablet and indapamide (LOZOL) 1.25 MG tablet pt needs these by tomorrow   [facility] [address]

## 2015-09-12 NOTE — Telephone Encounter (Signed)
They go ahead with one refill patient needs office have her schedule

## 2015-09-13 MED ORDER — INDAPAMIDE 1.25 MG PO TABS: ORAL_TABLET | ORAL | Status: DC

## 2015-09-13 MED ORDER — AMLODIPINE BESYLATE 5 MG PO TABS: 5.0000 mg | ORAL_TABLET | Freq: Every day | ORAL | Status: DC

## 2015-09-13 NOTE — Telephone Encounter (Signed)
Spoke with patient and informed her per Bishop sent in for requested medications to requested pharmacy. 30 day supply was sent in and informed patient office visit is needed. Patient verbalized understanding and was transferred to front desk to schedule office visit.

## 2015-10-04 ENCOUNTER — Ambulatory Visit (INDEPENDENT_AMBULATORY_CARE_PROVIDER_SITE_OTHER): Payer: 59 | Admitting: Family Medicine

## 2015-10-04 ENCOUNTER — Encounter: Payer: Self-pay | Admitting: Family Medicine

## 2015-10-04 VITALS — BP 128/92 | Ht 68.0 in | Wt 139.0 lb

## 2015-10-04 DIAGNOSIS — L819 Disorder of pigmentation, unspecified: Secondary | ICD-10-CM

## 2015-10-04 DIAGNOSIS — Z23 Encounter for immunization: Secondary | ICD-10-CM

## 2015-10-04 DIAGNOSIS — I10 Essential (primary) hypertension: Secondary | ICD-10-CM

## 2015-10-04 DIAGNOSIS — Z13220 Encounter for screening for lipoid disorders: Secondary | ICD-10-CM

## 2015-10-04 MED ORDER — AMLODIPINE BESYLATE 5 MG PO TABS: 5.0000 mg | ORAL_TABLET | Freq: Every day | ORAL | Status: DC

## 2015-10-04 MED ORDER — ALPRAZOLAM 0.5 MG PO TABS: ORAL_TABLET | ORAL | Status: DC

## 2015-10-04 MED ORDER — INDAPAMIDE 1.25 MG PO TABS: ORAL_TABLET | ORAL | Status: DC

## 2015-10-04 MED ORDER — ZOLPIDEM TARTRATE 5 MG PO TABS: 5.0000 mg | ORAL_TABLET | Freq: Every evening | ORAL | Status: DC | PRN

## 2015-10-04 MED ORDER — POTASSIUM CHLORIDE ER 10 MEQ PO TBCR: 10.0000 meq | EXTENDED_RELEASE_TABLET | Freq: Every day | ORAL | Status: DC

## 2015-10-04 NOTE — Progress Notes (Signed)
   Subjective:    Patient ID: [name], female    DOB: 02/26/1961, 54 y.o.   MRN: [id number]  Hypertension This is a chronic problem. Pertinent negatives include no chest pain. There are no compliance problems.    wants referral to dermatology for discoloration appove lip.  Tried triamcinolone cream with no results.   patient denies any chest tightness pressure pain shortness breath tries to eat healthy stays physically active. Flu vaccine today.    Review of Systems  Constitutional: Negative for activity change, appetite change and fatigue.  HENT: Negative for congestion.   Respiratory: Negative for cough.   Cardiovascular: Negative for chest pain.  Gastrointestinal: Negative for abdominal pain.  Endocrine: Negative for polydipsia and polyphagia.  Neurological: Negative for weakness.  Psychiatric/Behavioral: Negative for confusion.       Objective:   Physical Exam  Constitutional: She appears well-nourished. No distress.  Cardiovascular: Normal rate, regular rhythm and normal heart sounds.   No murmur heard. Pulmonary/Chest: Effort normal and breath sounds normal. No respiratory distress.  Musculoskeletal: She exhibits no edema.  Lymphadenopathy:    She has no cervical adenopathy.  Neurological: She is alert. She exhibits normal muscle tone.  Psychiatric: Her behavior is normal.  Vitals reviewed.         Assessment & Plan:   hypertension overall doing well medications prescribed follow-up if ongoing troubles Hyperpigmentation above the lip referral to dermatology  lab work indicated  refills on medication Follow-up in one year.  patient will discuss bone density and mammogram with her gynecologist

## 2015-10-13 ENCOUNTER — Encounter: Payer: Self-pay | Admitting: Family Medicine

## 2015-12-01 ENCOUNTER — Ambulatory Visit (INDEPENDENT_AMBULATORY_CARE_PROVIDER_SITE_OTHER): Payer: 59 | Admitting: Family Medicine

## 2015-12-01 ENCOUNTER — Encounter: Payer: Self-pay | Admitting: Family Medicine

## 2015-12-01 VITALS — Temp 98.4°F | Ht 68.0 in | Wt 139.8 lb

## 2015-12-01 DIAGNOSIS — J200 Acute bronchitis due to Mycoplasma pneumoniae: Secondary | ICD-10-CM

## 2015-12-01 MED ORDER — AZITHROMYCIN 250 MG PO TABS: ORAL_TABLET | ORAL | Status: DC

## 2015-12-01 NOTE — Progress Notes (Signed)
   Subjective:    Patient ID: [name], female    DOB: 02-Jun-1961, 55 y.o.   MRN: [id number]  Cough This is a new problem. Episode onset: 16 days. Pertinent negatives include no chest pain, ear pain, fever, rhinorrhea, shortness of breath or wheezing. Treatments tried: mucinex.   Patient has had approximately 2 and half weeks of coughing describes as a dry cough denies wheezing denies shortness breath no high fever chills or sweats minimal head congestion, minimal drainage. No vomiting.   Review of Systems  Constitutional: Negative for fever and activity change.  HENT: Negative for congestion, ear pain and rhinorrhea.   Eyes: Negative for discharge.  Respiratory: Positive for cough. Negative for shortness of breath and wheezing.   Cardiovascular: Negative for chest pain.       Objective:   Physical Exam  Constitutional: She appears well-developed.  HENT:  Head: Normocephalic.  Nose: Nose normal.  Mouth/Throat: Oropharynx is clear and moist. No oropharyngeal exudate.  Neck: Neck supple.  Cardiovascular: Normal rate and normal heart sounds.   No murmur heard. Pulmonary/Chest: Effort normal and breath sounds normal. She has no wheezes.  Lymphadenopathy:    She has no cervical adenopathy.  Skin: Skin is warm and dry.  Nursing note and vitals reviewed.         Assessment & Plan:  Dry cough Prolonged bronchitis Zithromax 5 days as directed If progressive symptoms or worse consider blood work and chest x-ray I do not feel patient has pneumonia currently. I do not feel the patient needs to go through additional testing currently

## 2016-01-16 ENCOUNTER — Encounter: Payer: Self-pay | Admitting: Family Medicine

## 2016-01-16 ENCOUNTER — Ambulatory Visit (INDEPENDENT_AMBULATORY_CARE_PROVIDER_SITE_OTHER): Payer: BLUE CROSS/BLUE SHIELD | Admitting: Family Medicine

## 2016-01-16 VITALS — Temp 98.7°F | Ht 68.0 in | Wt 140.6 lb

## 2016-01-16 DIAGNOSIS — J019 Acute sinusitis, unspecified: Secondary | ICD-10-CM

## 2016-01-16 MED ORDER — CEFPROZIL 500 MG PO TABS: 500.0000 mg | ORAL_TABLET | Freq: Two times a day (BID) | ORAL | Status: DC

## 2016-01-16 NOTE — Progress Notes (Signed)
   Subjective:    Patient ID: [name], female    DOB: 1961/09/08, 55 y.o.   MRN: [id number]  Sinus Problem This is a new problem. The current episode started 1 to 4 weeks ago. Associated symptoms include congestion, coughing, sinus pressure and a sore throat. Pertinent negatives include no ear pain or shortness of breath. Treatments tried: motrin.      Review of Systems  Constitutional: Negative for fever and activity change.  HENT: Positive for congestion, rhinorrhea, sinus pressure and sore throat. Negative for ear pain.   Eyes: Negative for discharge.  Respiratory: Positive for cough. Negative for shortness of breath and wheezing.   Cardiovascular: Negative for chest pain.       Objective:   Physical Exam  Constitutional: She appears well-developed.  HENT:  Head: Normocephalic.  Nose: Nose normal.  Mouth/Throat: Oropharynx is clear and moist. No oropharyngeal exudate.  Neck: Neck supple.  Cardiovascular: Normal rate and normal heart sounds.   No murmur heard. Pulmonary/Chest: Effort normal and breath sounds normal. She has no wheezes.  Lymphadenopathy:    She has no cervical adenopathy.  Skin: Skin is warm and dry.  Nursing note and vitals reviewed.         Assessment & Plan:   viral illness with secondary rhinosinusitis antibiotics prescribed warning signs discussed follow-up if ongoing troubles

## 2016-03-15 ENCOUNTER — Other Ambulatory Visit: Payer: Self-pay | Admitting: *Deleted

## 2016-03-15 MED ORDER — INDAPAMIDE 1.25 MG PO TABS: ORAL_TABLET | ORAL | Status: DC

## 2016-03-15 MED ORDER — AMLODIPINE BESYLATE 5 MG PO TABS: 5.0000 mg | ORAL_TABLET | Freq: Every day | ORAL | Status: DC

## 2016-03-28 ENCOUNTER — Other Ambulatory Visit: Payer: Self-pay | Admitting: Family Medicine

## 2016-03-28 MED ORDER — ALPRAZOLAM 0.5 MG PO TABS: ORAL_TABLET | ORAL | Status: DC

## 2016-03-28 NOTE — Telephone Encounter (Signed)
Received fax from pharmacy for refill on this medication.

## 2016-03-28 NOTE — Telephone Encounter (Signed)
May have this and 3 refills 

## 2016-08-22 ENCOUNTER — Ambulatory Visit (INDEPENDENT_AMBULATORY_CARE_PROVIDER_SITE_OTHER): Payer: 59 | Admitting: Family Medicine

## 2016-08-22 ENCOUNTER — Encounter: Payer: Self-pay | Admitting: Family Medicine

## 2016-08-22 VITALS — BP 110/80 | Temp 98.5°F | Ht 68.0 in | Wt 143.4 lb

## 2016-08-22 DIAGNOSIS — J019 Acute sinusitis, unspecified: Secondary | ICD-10-CM

## 2016-08-22 MED ORDER — CEFPROZIL 500 MG PO TABS: 500.0000 mg | ORAL_TABLET | Freq: Two times a day (BID) | ORAL | 0 refills | Status: DC

## 2016-08-22 NOTE — Progress Notes (Signed)
   Subjective:    Patient ID: [name], female    DOB: [birth year], 55 y.o.   MRN: [id number]  Sinusitis  This is a new problem. The current episode started in the past 7 days. The problem is unchanged. The maximum temperature recorded prior to her arrival was 100.4 - 100.9 F. The pain is moderate. Associated symptoms include congestion, headaches and a sore throat. (Back pain) Past treatments include oral decongestants. The treatment provided no relief.   Patient has no other concerns at this time.    Review of Systems  HENT: Positive for congestion and sore throat.   Neurological: Positive for headaches.       Objective:   Physical Exam Alert, mild malaise. Hydration good Vitals stable. frontal/ maxillary tenderness evident positive nasal congestion. pharynx normal neck supple  lungs clear/no crackles or wheezes. heart regular in rhythm        Assessment & Plan:  Impression rhinosinusitis likely post viral, discussed with patient. plan antibiotics prescribed. Questions answered. Symptomatic care discussed. warning signs discussed. WSL Based on history patient likely experienced parainfluenza equivalent at the start of this. Patient brought up attended the visit numerous concerns about blood pressure hyperlipidemia and hyperglycemia. Has not had a chronic problem checkup since last November patient advised follow-up with Dr. Nicki Reaper.

## 2016-10-17 ENCOUNTER — Other Ambulatory Visit: Payer: Self-pay | Admitting: Family Medicine

## 2016-10-17 NOTE — Telephone Encounter (Signed)
This +3 additional refills needs follow-up office visit by spring

## 2016-12-23 ENCOUNTER — Other Ambulatory Visit: Payer: Self-pay | Admitting: Family Medicine

## 2017-09-29 ENCOUNTER — Other Ambulatory Visit: Payer: Self-pay | Admitting: Family Medicine

## 2017-09-30 NOTE — Telephone Encounter (Signed)
Review prescription patient needs a call set an appointment then we can do prescription

## 2017-09-30 NOTE — Telephone Encounter (Signed)
scotts pt 

## 2017-09-30 NOTE — Telephone Encounter (Signed)
Lat wellness 09/2015. Please advise.Thanks,CS

## 2017-10-01 ENCOUNTER — Telehealth: Payer: Self-pay | Admitting: Family Medicine

## 2017-10-01 NOTE — Telephone Encounter (Signed)
1.  Let us refused the Ambien No. 2 please take it off epic list #3 send notification to the pharmacy that we are discontinuing Ambien thank you

## 2017-10-01 NOTE — Telephone Encounter (Signed)
Crystal-according to your message the patient needs a refill on amlodipine and indapamide she may have a 30-day supply with 1 refill of each and it is important for her to schedule a follow-up office visit with me thank you

## 2017-10-01 NOTE — Telephone Encounter (Signed)
I spoke with the pt and she states she does not need the  Ambien, she does not take it. What she does need is Amlodipine 5 mg one daily ,and Indapamide 1.25 one daily. She was transferred to the front to set up an appt with [place] soon. Do you want to refill the medications above. If so how many ? Please advise.

## 2017-10-02 MED ORDER — AMLODIPINE BESYLATE 5 MG PO TABS: 5.0000 mg | ORAL_TABLET | Freq: Every day | ORAL | 1 refills | Status: DC

## 2017-10-02 MED ORDER — INDAPAMIDE 1.25 MG PO TABS: 1.2500 mg | ORAL_TABLET | Freq: Every morning | ORAL | 1 refills | Status: DC

## 2017-10-02 NOTE — Addendum Note (Signed)
Addended by: Launa Grill on: 10/02/2017 08:18 AM   Modules accepted: Orders

## 2017-10-02 NOTE — Telephone Encounter (Signed)
Refills sent in to pharmacy 

## 2017-10-04 NOTE — Addendum Note (Signed)
Addended by: Karle Barr on: 10/04/2017 11:52 AM   Modules accepted: Orders

## 2017-10-21 ENCOUNTER — Encounter: Payer: Self-pay | Admitting: Nurse Practitioner

## 2017-10-21 ENCOUNTER — Ambulatory Visit: Payer: BLUE CROSS/BLUE SHIELD | Admitting: Nurse Practitioner

## 2017-10-21 VITALS — BP 120/84 | Ht 68.0 in | Wt 148.5 lb

## 2017-10-21 DIAGNOSIS — G4700 Insomnia, unspecified: Secondary | ICD-10-CM

## 2017-10-21 DIAGNOSIS — I10 Essential (primary) hypertension: Secondary | ICD-10-CM

## 2017-10-21 MED ORDER — ALPRAZOLAM 0.5 MG PO TABS: ORAL_TABLET | ORAL | 2 refills | Status: DC

## 2017-10-21 NOTE — Progress Notes (Signed)
Subjective: Presents for follow-up on her hypertension.  Staying very active.  Overall healthy diet.  Minimal change in her weight.  No chest pain/ischemic type pain or shortness of breath.  No edema.  Gets regular preventive health physicals.  Had lab work done through her gynecologist in May, copy unavailable during visit.  Compliant with medications except for potassium.  Takes an occasional alprazolam for sleep.  Needs a refill today.  Defers need for daily medicine for anxiety at this time.  Objective:   BP 120/84   Ht 5\' 8"  (1.727 m)   Wt 148 lb 8 oz (67.4 kg)   LMP 11/25/2012   BMI 22.58 kg/m  NAD.  Alert, oriented.  Calm affect.  Lungs clear.  Heart regular rate and rhythm.  Carotids no bruits or thrills.  Abdomen soft nondistended nontender.  Lower extremities no edema.  Assessment:   Problem List Items Addressed This Visit      Cardiovascular and Mediastinum   HTN (hypertension), benign - Primary    Other Visit Diagnoses    Insomnia, unspecified type           Plan:   Meds ordered this encounter  Medications  . ALPRAZolam (XANAX) 0.5 MG tablet    Sig: 1/2-1 po BID prn anxiety    Dispense:  60 tablet    Refill:  2    Order Specific Question:   Supervising Provider    Answer:   Mikey Kirschner [[id number]]   Continue current medications as directed.  Use Xanax sparingly mainly for sleep at nighttime.  Patient to get copy of her recent labs in May sent to our office.  Declines flu vaccine. Return in about 6 months (around 04/20/2018) for recheck.

## 2017-11-25 ENCOUNTER — Encounter: Payer: Self-pay | Admitting: Family Medicine

## 2017-11-25 ENCOUNTER — Ambulatory Visit: Payer: BLUE CROSS/BLUE SHIELD | Admitting: Family Medicine

## 2017-11-25 VITALS — BP 122/80 | Temp 98.6°F | Ht 68.0 in | Wt 148.0 lb

## 2017-11-25 DIAGNOSIS — J019 Acute sinusitis, unspecified: Secondary | ICD-10-CM

## 2017-11-25 MED ORDER — CEFDINIR 300 MG PO CAPS: 300.0000 mg | ORAL_CAPSULE | Freq: Two times a day (BID) | ORAL | 0 refills | Status: DC

## 2017-11-25 NOTE — Progress Notes (Signed)
   Subjective:    Patient ID: [name], female    DOB: [birth year], 56 y.o.   MRN: [id number]  Sinusitis  This is a new problem. Episode onset: 3 days. Associated symptoms include congestion. (Sore throat, soreness in ribs) Treatments tried: dayquil.    Son ame home sick form [place]  Pt was sick  Sat statreted with nasal drange and stuffy  Feels sick andlethagi  yellwe discharge  Due to fly out o Wednesday   Not much headache   No ear ahe  Sore this morn worse   Do not smoke   cymbocol  Day quil prn    elliot   Review of Systems  HENT: Positive for congestion.        Objective:   Physical Exam  Alert, mild malaise. Hydration good Vitals stable. frontal/ maxillary tenderness evident positive nasal congestion. pharynx normal neck supple  lungs clear/no crackles or wheezes. heart regular in rhythm       Assessment & Plan:  Impression rhinosinusitis likely post viral, discussed with patient. plan antibiotics prescribed. Questions answered. Symptomatic care discussed. warning signs discussed. WSL

## 2017-12-02 ENCOUNTER — Other Ambulatory Visit: Payer: Self-pay | Admitting: Family Medicine

## 2018-01-20 ENCOUNTER — Other Ambulatory Visit: Payer: Self-pay | Admitting: Family Medicine

## 2018-04-24 ENCOUNTER — Other Ambulatory Visit: Payer: Self-pay | Admitting: Family Medicine

## 2018-05-20 ENCOUNTER — Telehealth: Payer: Self-pay | Admitting: Family Medicine

## 2018-05-20 MED ORDER — AMLODIPINE BESYLATE 5 MG PO TABS: 5.0000 mg | ORAL_TABLET | Freq: Every day | ORAL | 0 refills | Status: DC

## 2018-05-20 NOTE — Telephone Encounter (Signed)
Prescription sent electronically to pharmacy. Patient notified and scheduled follow up office visit 

## 2018-05-20 NOTE — Telephone Encounter (Signed)
Patient is needing a refill on her amLODipine (NORVASC) 5 MG tablet.  She is not completely out.  [facility] [address] in [place], [place]

## 2018-05-21 ENCOUNTER — Ambulatory Visit: Payer: BLUE CROSS/BLUE SHIELD | Admitting: Family Medicine

## 2018-05-21 ENCOUNTER — Encounter: Payer: Self-pay | Admitting: Family Medicine

## 2018-05-21 VITALS — Ht 68.0 in | Wt 145.1 lb

## 2018-05-21 DIAGNOSIS — R000 Tachycardia, unspecified: Secondary | ICD-10-CM

## 2018-05-21 DIAGNOSIS — Z13220 Encounter for screening for lipoid disorders: Secondary | ICD-10-CM

## 2018-05-21 DIAGNOSIS — F411 Generalized anxiety disorder: Secondary | ICD-10-CM

## 2018-05-21 DIAGNOSIS — I10 Essential (primary) hypertension: Secondary | ICD-10-CM

## 2018-05-21 MED ORDER — ALPRAZOLAM 0.5 MG PO TABS: ORAL_TABLET | ORAL | 5 refills | Status: DC

## 2018-05-21 MED ORDER — SERTRALINE HCL 50 MG PO TABS: 50.0000 mg | ORAL_TABLET | Freq: Every day | ORAL | 5 refills | Status: DC

## 2018-05-21 MED ORDER — POTASSIUM CHLORIDE ER 10 MEQ PO TBCR: 10.0000 meq | EXTENDED_RELEASE_TABLET | Freq: Every day | ORAL | 5 refills | Status: DC

## 2018-05-21 MED ORDER — INDAPAMIDE 1.25 MG PO TABS: 1.2500 mg | ORAL_TABLET | Freq: Every morning | ORAL | 5 refills | Status: DC

## 2018-05-21 MED ORDER — AMLODIPINE BESYLATE 5 MG PO TABS: 5.0000 mg | ORAL_TABLET | Freq: Every day | ORAL | 5 refills | Status: DC

## 2018-05-21 NOTE — Progress Notes (Signed)
   Subjective:    Patient ID: [name], female    DOB: [birth year], 57 y.o.   MRN: [id number]  HPI Very nice patient Patient for blood pressure check up.  The patient does have hypertension.  The patient is on medication.  Patient relates compliance with meds. Todays BP reviewed with the patient. Patient denies issues with medication. Patient relates reasonable diet. Patient tries to minimize salt. Patient aware of BP goals.  Patient does take their medication as prescribed.  The patient does have known anxiety condition and is here for follow-up.  Patient states it is necessary to help them function.  Patient denies abusing the medication.  The patient also has tried other various stress reduction techniques.  Patient denies negative side effects.  Patient understands the importance of not exceeding recommended amount.  Patient also understands the importance of feeling drugged or drowsy not to drive.  Patient understands to report to [place] if any side effects.  Patient having some depression symptoms but this is related to a very stressful aspect that her work Patient is here today to follow up on her [place].Unable to hear bp.Gad 7 given to the pt and was not finished with it.  Patient also states one time when she was stressed her heart rate went up denied any chest tightness pressure pain shortness of breath Review of Systems  Constitutional: Negative for activity change, appetite change and fatigue.  HENT: Negative for congestion and rhinorrhea.   Respiratory: Negative for cough and shortness of breath.   Cardiovascular: Negative for chest pain and palpitations.  Gastrointestinal: Negative for abdominal pain.  Endocrine: Negative for polydipsia and polyphagia.  Skin: Negative for color change.  Neurological: Negative for weakness.  Psychiatric/Behavioral: Negative for confusion.       Objective:   Physical Exam  Constitutional: She appears well-nourished. No distress.    Eyes: Right eye exhibits no discharge. Left eye exhibits no discharge.  Neck: No tracheal deviation present.  Cardiovascular: Normal rate, regular rhythm and normal heart sounds.  No murmur heard. Pulmonary/Chest: Effort normal and breath sounds normal. No respiratory distress.  Musculoskeletal: She exhibits no edema.  Lymphadenopathy:    She has no cervical adenopathy.  Neurological: She is alert. She exhibits normal muscle tone.  Psychiatric: Her behavior is normal.  Vitals reviewed.         Assessment & Plan:  HTN- Patient was seen today as part of a visit regarding hypertension. The importance of healthy diet and regular physical activity was discussed. The importance of compliance with medications discussed.  Ideal goal is to keep blood pressure low elevated levels certainly below 465/68 when possible.  The patient was counseled that keeping blood pressure under control lessen his risk of complications.  The importance of regular follow-ups was discussed with the patient.  Low-salt diet such as DASH recommended.  Regular physical activity was recommended as well.  Patient was advised to keep regular follow-ups.    Importance of healthy eating regular physical activity discussed May use Xanax on a infrequent basis refills given Low-dose antidepressant recommend a Patient will give [place] some feedback over the course of the next 2 to 3 weeks how this is doing she will follow-up in 6 to 8 weeks follow-up sooner if any problems also will do regular follow-up visits every 6 months

## 2018-06-02 ENCOUNTER — Encounter: Payer: Self-pay | Admitting: Family Medicine
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
## 2018-06-18 ENCOUNTER — Encounter: Payer: Self-pay | Admitting: Family Medicine

## 2018-06-18 ENCOUNTER — Ambulatory Visit: Payer: BLUE CROSS/BLUE SHIELD | Admitting: Family Medicine

## 2018-06-18 VITALS — Temp 99.0°F | Wt 144.8 lb

## 2018-06-18 DIAGNOSIS — J209 Acute bronchitis, unspecified: Secondary | ICD-10-CM | POA: Diagnosis not present

## 2018-06-18 MED ORDER — HYDROCODONE-HOMATROPINE 5-1.5 MG/5ML PO SYRP: 5.0000 mL | ORAL_SOLUTION | Freq: Four times a day (QID) | ORAL | 0 refills | Status: DC | PRN

## 2018-06-18 MED ORDER — BENZONATATE 200 MG PO CAPS: 200.0000 mg | ORAL_CAPSULE | Freq: Three times a day (TID) | ORAL | 2 refills | Status: DC | PRN

## 2018-06-18 NOTE — Progress Notes (Signed)
  Subjective:     Patient ID: [name], female   DOB: [birth year], 57 y.o.   MRN: [id number]  HPI Patient presents having multiple days of chest congestion coughing bringing up phlegm at times discolored other times not denies high fever chills denies wheezing difficulty breathing energy level overall fair but at times feels very fatigued denies nausea vomiting diarrhea PMH benign feels like she is starting little bit better but the cough is keeping her awake at night  Review of Systems  Constitutional: Negative for activity change and fever.  HENT: Positive for congestion and rhinorrhea. Negative for ear pain.   Eyes: Negative for discharge.  Respiratory: Positive for cough. Negative for shortness of breath and wheezing.   Cardiovascular: Negative for chest pain.       Objective:   Physical Exam  Constitutional: She appears well-developed.  HENT:  Head: Normocephalic.  Nose: Nose normal.  Mouth/Throat: Oropharynx is clear and moist. No oropharyngeal exudate.  Neck: Neck supple.  Cardiovascular: Normal rate and normal heart sounds.  No murmur heard. Pulmonary/Chest: Effort normal and breath sounds normal. She has no wheezes.  Lymphadenopathy:    She has no cervical adenopathy.  Skin: Skin is warm and dry.  Nursing note and vitals reviewed.  15 minutes was spent with patient today discussing healthcare issues which they came.  More than 50% of this visit-total duration of visit-was spent in counseling and coordination of care.  Please see diagnosis regarding the focus of this coordination and care     Assessment:     Viral bronchitis.  On antibiotics currently    Plan:     His medications these were sent and caution drowsiness not for frequent use Hycodan if progressive symptoms or worsen to notify us call us if any problems

## 2018-06-19 ENCOUNTER — Telehealth: Payer: Self-pay

## 2018-06-19 ENCOUNTER — Other Ambulatory Visit: Payer: Self-pay | Admitting: Family Medicine

## 2018-06-19 NOTE — Telephone Encounter (Signed)
Contacted patient and patient states that she seen Dr.Scott yesterday and he ordered Tess Perles 200 mg yesterday. Pt states she needs something for coughing at night. Please advise.

## 2018-06-19 NOTE — Telephone Encounter (Signed)
Ask [facility] what alternatives

## 2018-06-19 NOTE — Telephone Encounter (Signed)
Tess perles 100mg numb 30 one tid prn cough 

## 2018-06-19 NOTE — Telephone Encounter (Signed)
Patient is asking for a refill on Hydrocode/hom 5-15 mg/5 ml syrup. # 75 .take 5 ml by mouth Q 6 hours prn cough.[facility] [place] [address]

## 2018-06-19 NOTE — Telephone Encounter (Signed)
Pharmacy called to stated that Hycodan is on back order and is un available at this time - Please advise

## 2018-06-19 NOTE — Telephone Encounter (Signed)
Tessalon perles  

## 2018-06-20 ENCOUNTER — Other Ambulatory Visit: Payer: Self-pay | Admitting: Family Medicine

## 2018-06-20 MED ORDER — AZITHROMYCIN 250 MG PO TABS: ORAL_TABLET | ORAL | 0 refills | Status: DC

## 2018-06-20 MED ORDER — HYDROCODONE-HOMATROPINE 5-1.5 MG/5ML PO SYRP: 5.0000 mL | ORAL_SOLUTION | Freq: Four times a day (QID) | ORAL | 0 refills | Status: AC | PRN

## 2018-06-20 NOTE — Telephone Encounter (Addendum)
Patient stated she feels like her cough has gotten worse she is sore on her right shoulder pain from coughing and feels she needs and antibiotic. Hycodan was not available at [facility] in [place]. Patient would like a new prescription sent electronically for the hycodan to [place] with the antibiotic(confirmed it is available there). Patient will be coming to [place] from [place] this evening

## 2018-06-20 NOTE — Telephone Encounter (Signed)
Pt contacted and aware that the scripts were sent into [place] and that if she becomes worse to follow up here or go to ED over weekend if dramatically worse.  Pt verbalized understanding.

## 2018-06-20 NOTE — Telephone Encounter (Signed)
I sent her prescription to [facility] for [place] as well as Zithromax she should pick up both prescriptions there.  Should she have progressive troubles or worsening condition I recommend that she follow-up with [place] ASAP-obviously ER if dramatically worse over the weekend

## 2018-07-09 LAB — BASIC METABOLIC PANEL
BUN/Creatinine Ratio: 10 (ref 9–23)
BUN: 10 mg/dL (ref 6–24)
CALCIUM: 9.5 mg/dL (ref 8.7–10.2)
CHLORIDE: 98 mmol/L (ref 96–106)
CO2: 30 mmol/L — AB (ref 20–29)
Creatinine, Ser: 1.01 mg/dL — ABNORMAL HIGH (ref 0.57–1.00)
GFR calc non Af Amer: 62 mL/min/{1.73_m2} (ref >59)
GFR, EST AFRICAN AMERICAN: 71 mL/min/{1.73_m2} (ref >59)
GLUCOSE: 91 mg/dL (ref 65–99)
POTASSIUM: 3.7 mmol/L (ref 3.5–5.2)
Sodium: 141 mmol/L (ref 134–144)

## 2018-07-09 LAB — LIPID PANEL
CHOLESTEROL TOTAL: 258 mg/dL — AB (ref 100–199)
Chol/HDL Ratio: 3.3 ratio (ref 0.0–4.4)
HDL: 78 mg/dL (ref >39)
LDL Calculated: 167 mg/dL — ABNORMAL HIGH (ref 0–99)
TRIGLYCERIDES: 63 mg/dL (ref 0–149)
VLDL CHOLESTEROL CAL: 13 mg/dL (ref 5–40)

## 2018-07-09 LAB — TSH: TSH: 2.05 u[IU]/mL (ref 0.450–4.500)

## 2018-11-03 ENCOUNTER — Other Ambulatory Visit: Payer: Self-pay | Admitting: Family Medicine

## 2018-11-03 ENCOUNTER — Other Ambulatory Visit: Payer: Self-pay | Admitting: *Deleted

## 2018-12-10 ENCOUNTER — Telehealth: Payer: Self-pay | Admitting: Family Medicine

## 2018-12-10 ENCOUNTER — Other Ambulatory Visit: Payer: Self-pay

## 2018-12-10 MED ORDER — INDAPAMIDE 1.25 MG PO TABS: 1.2500 mg | ORAL_TABLET | Freq: Every morning | ORAL | 3 refills | Status: DC

## 2018-12-10 MED ORDER — AMLODIPINE BESYLATE 5 MG PO TABS: 5.0000 mg | ORAL_TABLET | Freq: Every day | ORAL | 3 refills | Status: DC

## 2018-12-10 MED ORDER — AMLODIPINE BESYLATE 5 MG PO TABS
5.0000 mg | ORAL_TABLET | Freq: Every day | ORAL | 3 refills | Status: DC
Start: 2018-12-10 — End: 2018-12-10

## 2018-12-10 NOTE — Telephone Encounter (Signed)
May have this +3 refills on both medicines follow-up in April please

## 2018-12-10 NOTE — Telephone Encounter (Signed)
Needing refill for  amLODipine (NORVASC) 5 MG tablet   indapamide (LOZOL) 1.25 MG tablet    Ph[facility]: [facility], [place], [place]  Pt aware she needs to have a follow up in office, pt states she just transitioned jobs and wont have active insurance until 02/25/19 and would like to know if she could have enough medication filled to make it to April. Advise.

## 2018-12-10 NOTE — Telephone Encounter (Signed)
Patient is aware we have sent to the requested pharmacy.

## 2019-04-03 ENCOUNTER — Telehealth: Payer: Self-pay | Admitting: Family Medicine

## 2019-04-06 NOTE — Telephone Encounter (Signed)
May have this +1 refill needs follow-up office visit 

## 2019-04-13 ENCOUNTER — Other Ambulatory Visit: Payer: Self-pay | Admitting: *Deleted

## 2019-04-13 ENCOUNTER — Telehealth: Payer: Self-pay | Admitting: Family Medicine

## 2019-04-13 ENCOUNTER — Other Ambulatory Visit: Payer: Self-pay | Admitting: Family Medicine

## 2019-04-13 MED ORDER — ALPRAZOLAM 0.5 MG PO TABS: ORAL_TABLET | ORAL | 1 refills | Status: DC

## 2019-04-13 NOTE — Telephone Encounter (Signed)
Last seen 04/2018 for wellness.

## 2019-04-13 NOTE — Telephone Encounter (Signed)
Pt needs refill on amLODipine (NORVASC) 5 MG tablet  Pt is scheduled for June to see Dr. Nicki Reaper  Please advise & call pt   [name] @ [address]

## 2019-04-13 NOTE — Telephone Encounter (Signed)
Pt has set up CPE 05/21/2019

## 2019-04-13 NOTE — Telephone Encounter (Signed)
Script printed. Await signature then will fax and call pt

## 2019-04-13 NOTE — Telephone Encounter (Signed)
May have this and 1 refill

## 2019-04-13 NOTE — Telephone Encounter (Signed)
Medication was sent in by Vicente Males LPN 35/33/1740

## 2019-04-16 ENCOUNTER — Other Ambulatory Visit: Payer: Self-pay | Admitting: *Deleted

## 2019-04-16 NOTE — Telephone Encounter (Signed)
Prescription sent  And pt notified on voicemail

## 2019-05-21 ENCOUNTER — Ambulatory Visit (INDEPENDENT_AMBULATORY_CARE_PROVIDER_SITE_OTHER): Payer: Managed Care, Other (non HMO) | Admitting: Family Medicine

## 2019-05-21 ENCOUNTER — Encounter: Payer: Self-pay | Admitting: Family Medicine

## 2019-05-21 ENCOUNTER — Other Ambulatory Visit: Payer: Self-pay

## 2019-05-21 VITALS — BP 112/74 | Temp 98.7°F | Ht 67.5 in | Wt 150.0 lb

## 2019-05-21 DIAGNOSIS — E785 Hyperlipidemia, unspecified: Secondary | ICD-10-CM | POA: Diagnosis not present

## 2019-05-21 DIAGNOSIS — Z0000 Encounter for general adult medical examination without abnormal findings: Secondary | ICD-10-CM | POA: Diagnosis not present

## 2019-05-21 DIAGNOSIS — I10 Essential (primary) hypertension: Secondary | ICD-10-CM

## 2019-05-21 MED ORDER — ALPRAZOLAM 0.5 MG PO TABS: ORAL_TABLET | ORAL | 3 refills | Status: DC

## 2019-05-21 MED ORDER — AMLODIPINE BESYLATE 5 MG PO TABS: 5.0000 mg | ORAL_TABLET | Freq: Every day | ORAL | 3 refills | Status: DC

## 2019-05-21 MED ORDER — VALACYCLOVIR HCL 500 MG PO TABS: 500.0000 mg | ORAL_TABLET | Freq: Two times a day (BID) | ORAL | 0 refills | Status: DC

## 2019-05-21 MED ORDER — INDAPAMIDE 1.25 MG PO TABS: 1.2500 mg | ORAL_TABLET | Freq: Every morning | ORAL | 3 refills | Status: DC

## 2019-05-21 MED ORDER — POTASSIUM CHLORIDE ER 10 MEQ PO TBCR: 10.0000 meq | EXTENDED_RELEASE_TABLET | Freq: Every day | ORAL | 3 refills | Status: DC

## 2019-05-21 NOTE — Progress Notes (Signed)
Subjective:    Patient ID: [name], female    DOB: 31-May-1961, 58 y.o.   MRN: [id number]  HPI The patient comes in today for a wellness visit.    A review of their health history was completed.  A review of medications was also completed.  Any needed refills; 90 day supply of amlodipine and indapamide  Eating habits: health conscious  Falls/  MVA accidents in past few months: none  Regular exercise: walks 2 -3 miles 3 -5 days per week  Specialist pt sees on regular basis: none  Preventative health issues were discussed.   Additional concerns: requesting a letter of medical necessary to get a water pic for her teeth.   I did go ahead and give her a letter of medical necessity for [facility]  Patient for blood pressure check up.  The patient does have hypertension.  The patient is on medication.  Patient relates compliance with meds. Todays BP reviewed with the patient. Patient denies issues with medication. Patient relates reasonable diet. Patient tries to minimize salt. Patient aware of BP goals.  This she does try to do the best she can at watching her diet taking her medication.  She also states at times she gets more red meat than she should.  She understands the importance of keeping up with her female health checkups.  She occasionally uses Xanax to help her with her nerves but she denies abusing it mainly she uses it at nighttime if she is very wound up and having a hard time going to sleep    Review of Systems  Constitutional: Negative for activity change, appetite change and fatigue.  HENT: Negative for congestion and rhinorrhea.   Eyes: Negative for discharge.  Respiratory: Negative for cough, chest tightness and wheezing.   Cardiovascular: Negative for chest pain.  Gastrointestinal: Negative for abdominal pain, blood in stool and vomiting.  Endocrine: Negative for polyphagia.  Genitourinary: Negative for difficulty urinating and frequency.   Musculoskeletal: Negative for neck pain.  Skin: Negative for color change.  Allergic/Immunologic: Negative for environmental allergies and food allergies.  Neurological: Negative for weakness and headaches.  Psychiatric/Behavioral: Negative for agitation and behavioral problems.       Objective:   Physical Exam Constitutional:      Appearance: She is well-developed.  HENT:     Head: Normocephalic.     Right Ear: External ear normal.     Left Ear: External ear normal.  Eyes:     Pupils: Pupils are equal, round, and reactive to light.  Neck:     Musculoskeletal: Normal range of motion.     Thyroid: No thyromegaly.  Cardiovascular:     Rate and Rhythm: Normal rate and regular rhythm.     Heart sounds: Normal heart sounds. No murmur.  Pulmonary:     Effort: Pulmonary effort is normal. No respiratory distress.     Breath sounds: Normal breath sounds. No wheezing.  Abdominal:     General: Bowel sounds are normal. There is no distension.     Palpations: Abdomen is soft. There is no mass.     Tenderness: There is no abdominal tenderness.  Musculoskeletal: Normal range of motion.        General: No tenderness.  Lymphadenopathy:     Cervical: No cervical adenopathy.  Skin:    General: Skin is warm and dry.  Neurological:     Mental Status: She is alert and oriented to person, place, and time.  Motor: No abnormal muscle tone.  Psychiatric:        Behavior: Behavior normal.           Assessment & Plan:  Adult wellness-complete.wellness physical was conducted today. Importance of diet and exercise were discussed in detail.  In addition to this a discussion regarding safety was also covered. We also reviewed over immunizations and gave recommendations regarding current immunization needed for age.  In addition to this additional areas were also touched on including: Preventative health exams needed:  Colonoscopy 2023  Patient was advised yearly wellness exam   HTN-  Patient was seen today as part of a visit regarding hypertension. The importance of healthy diet and regular physical activity was discussed. The importance of compliance with medications discussed.  Ideal goal is to keep blood pressure low elevated levels certainly below 761/47 when possible.  The patient was counseled that keeping blood pressure under control lessen his risk of complications.  The importance of regular follow-ups was discussed with the patient.  Low-salt diet such as DASH recommended.  Regular physical activity was recommended as well.  Patient was advised to keep regular follow-ups.  Patient also history of vitamin D deficiency recheck vitamin D  Mild hyperlipidemia but not severe enough to be on medication dietary measures discussed healthy diet check follow-up labs in approximately 6 to 8 weeks may or may not need to be on medication

## 2019-05-21 NOTE — Patient Instructions (Signed)

## 2019-08-24 ENCOUNTER — Encounter: Payer: Self-pay | Admitting: Family Medicine

## 2019-08-25 ENCOUNTER — Ambulatory Visit (INDEPENDENT_AMBULATORY_CARE_PROVIDER_SITE_OTHER): Payer: Managed Care, Other (non HMO) | Admitting: Family Medicine

## 2019-08-25 DIAGNOSIS — F0781 Postconcussional syndrome: Secondary | ICD-10-CM | POA: Diagnosis not present

## 2019-08-25 DIAGNOSIS — R51 Headache: Secondary | ICD-10-CM

## 2019-08-25 DIAGNOSIS — R412 Retrograde amnesia: Secondary | ICD-10-CM | POA: Diagnosis not present

## 2019-08-25 DIAGNOSIS — G44309 Post-traumatic headache, unspecified, not intractable: Secondary | ICD-10-CM | POA: Diagnosis not present

## 2019-08-25 DIAGNOSIS — R519 Headache, unspecified: Secondary | ICD-10-CM

## 2019-08-25 DIAGNOSIS — S060X1A Concussion with loss of consciousness of 30 minutes or less, initial encounter: Secondary | ICD-10-CM

## 2019-08-25 NOTE — Telephone Encounter (Signed)
So it may not be quite as simple as we hope  In order to do a stat scan she may have to go to the ER But if we do a virtual visit with the patient and order a stat scan possibly we will be able to get it later today or on Wednesday which would seem reasonable. Please talk with the patient If she wants to do a virtual visit we will try to squeeze that in as soon as possible in order to the ball rolling on this
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
## 2019-08-25 NOTE — Telephone Encounter (Signed)
Called pt and she wants to do virtual visit today with dr scott.

## 2019-08-25 NOTE — Telephone Encounter (Signed)
Pt added to schedule to do virtual visit today.

## 2019-08-25 NOTE — Telephone Encounter (Signed)
Left message to return call 

## 2019-08-25 NOTE — Telephone Encounter (Signed)
Called pt and she states no confusion, just feels tired and head is sore. Pt would like to do scan at [place].

## 2019-08-26 ENCOUNTER — Telehealth: Payer: Self-pay | Admitting: *Deleted

## 2019-08-26 ENCOUNTER — Ambulatory Visit
Admission: RE | Admit: 2019-08-26 | Discharge: 2019-08-26 | Disposition: A | Discharge status: Home / Self Care | Payer: Managed Care, Other (non HMO) | Source: Ambulatory Visit | Attending: Family Medicine | Admitting: Family Medicine

## 2019-08-26 ENCOUNTER — Other Ambulatory Visit: Payer: Self-pay

## 2019-08-26 NOTE — Telephone Encounter (Signed)
Ct scan was approved and scheduled at [facility]. [place] location at 3pm today. Pt was notified of appt and location.  Pt can be reached for results at [phone]

## 2019-08-27 NOTE — Telephone Encounter (Signed)
I was able to communicate with the patient regarding the results

## 2019-11-03 ENCOUNTER — Encounter: Payer: Self-pay | Admitting: Family Medicine

## 2019-11-03 ENCOUNTER — Other Ambulatory Visit: Payer: Self-pay

## 2019-11-03 ENCOUNTER — Ambulatory Visit (INDEPENDENT_AMBULATORY_CARE_PROVIDER_SITE_OTHER): Payer: Managed Care, Other (non HMO) | Admitting: Family Medicine

## 2019-11-03 VITALS — BP 122/80 | Temp 98.0°F

## 2019-11-03 DIAGNOSIS — I10 Essential (primary) hypertension: Secondary | ICD-10-CM | POA: Diagnosis not present

## 2019-11-03 DIAGNOSIS — E785 Hyperlipidemia, unspecified: Secondary | ICD-10-CM | POA: Diagnosis not present

## 2019-11-03 DIAGNOSIS — R42 Dizziness and giddiness: Secondary | ICD-10-CM

## 2019-11-03 DIAGNOSIS — R270 Ataxia, unspecified: Secondary | ICD-10-CM

## 2019-11-03 DIAGNOSIS — R350 Frequency of micturition: Secondary | ICD-10-CM

## 2019-11-03 DIAGNOSIS — E876 Hypokalemia: Secondary | ICD-10-CM

## 2019-11-03 DIAGNOSIS — R29898 Other symptoms and signs involving the musculoskeletal system: Secondary | ICD-10-CM

## 2019-11-03 DIAGNOSIS — N3001 Acute cystitis with hematuria: Secondary | ICD-10-CM

## 2019-11-03 LAB — POCT URINALYSIS DIPSTICK
Blood, UA: POSITIVE
Spec Grav, UA: <=1.005 — AB (ref 1.010–1.025)
pH, UA: 7 (ref 5.0–8.0)

## 2019-11-03 LAB — POCT HEMOGLOBIN: Hemoglobin: 13.3 g/dL (ref 11–14.6)

## 2019-11-03 MED ORDER — CEPHALEXIN 500 MG PO CAPS: 500.0000 mg | ORAL_CAPSULE | Freq: Four times a day (QID) | ORAL | 0 refills | Status: DC
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
## 2019-11-04 ENCOUNTER — Other Ambulatory Visit: Payer: Self-pay

## 2019-11-04 DIAGNOSIS — Z20822 Contact with and (suspected) exposure to covid-19: Secondary | ICD-10-CM

## 2019-11-04 LAB — BASIC METABOLIC PANEL
BUN/Creatinine Ratio: 12 (ref 9–23)
BUN: 13 mg/dL (ref 6–24)
CO2: 27 mmol/L (ref 20–29)
Calcium: 9.9 mg/dL (ref 8.7–10.2)
Chloride: 99 mmol/L (ref 96–106)
Creatinine, Ser: 1.06 mg/dL — ABNORMAL HIGH (ref 0.57–1.00)
GFR calc Af Amer: 66 mL/min/{1.73_m2} (ref >59)
GFR calc non Af Amer: 58 mL/min/{1.73_m2} — ABNORMAL LOW (ref >59)
Glucose: 98 mg/dL (ref 65–99)
Potassium: 3.2 mmol/L — ABNORMAL LOW (ref 3.5–5.2)
Sodium: 143 mmol/L (ref 134–144)

## 2019-11-04 LAB — MAGNESIUM: Magnesium: 2.2 mg/dL (ref 1.6–2.3)

## 2019-11-05 MED ORDER — POTASSIUM CHLORIDE ER 10 MEQ PO TBCR: EXTENDED_RELEASE_TABLET | ORAL | 1 refills | Status: DC

## 2019-11-05 NOTE — Progress Notes (Signed)
Nurses-her potassium is slightly low at 3.2 magnesium is fine. Patient needs to start increasing potassiumPotassium 10 M EQ-new directions 1 twice daily-#60-5 refills I recommend follow-up lab work again in her next visit 6 monthsUrine culture pending we will notify patient of results when it comes in

## 2019-11-05 NOTE — Addendum Note (Signed)
Addended by: Vicente Males on: 11/05/2019 11:27 AM   Modules accepted: Orders

## 2019-11-06 LAB — URINE CULTURE

## 2019-11-06 LAB — NOVEL CORONAVIRUS, NAA: SARS-CoV-2, NAA: NOT DETECTED

## 2019-11-12 ENCOUNTER — Encounter: Payer: Self-pay | Admitting: Family Medicine

## 2020-01-12 ENCOUNTER — Other Ambulatory Visit: Payer: Self-pay | Admitting: Family Medicine

## 2020-01-13 ENCOUNTER — Telehealth: Payer: Self-pay | Admitting: Family Medicine

## 2020-01-13 MED ORDER — VALACYCLOVIR HCL 500 MG PO TABS: 500.0000 mg | ORAL_TABLET | Freq: Two times a day (BID) | ORAL | 5 refills | Status: DC

## 2020-01-13 NOTE — Telephone Encounter (Signed)
May have 6 refills 

## 2020-01-13 NOTE — Telephone Encounter (Signed)
Medication sent in and pt is aware  

## 2020-01-13 NOTE — Telephone Encounter (Signed)
Please advise. Thank you

## 2020-01-13 NOTE — Telephone Encounter (Signed)
Patient is requesting refill on valacyclovir 500 mg last filled 05/21/2019 and was last seen 12/8 .send to [facility]

## 2020-02-16 ENCOUNTER — Telehealth: Payer: Self-pay | Admitting: Family Medicine

## 2020-02-16 DIAGNOSIS — D72819 Decreased white blood cell count, unspecified: Secondary | ICD-10-CM

## 2020-02-16 DIAGNOSIS — E785 Hyperlipidemia, unspecified: Secondary | ICD-10-CM

## 2020-02-16 DIAGNOSIS — I10 Essential (primary) hypertension: Secondary | ICD-10-CM

## 2020-02-16 DIAGNOSIS — E559 Vitamin D deficiency, unspecified: Secondary | ICD-10-CM

## 2020-02-16 NOTE — Telephone Encounter (Signed)
Patient is requesting labs for her 6 month follow up for medication refills. She states at her physical 05/21/19 was told to hold off 3 months and do labs later but now she wants them done. I tried explaining she needs to check with her insurance to see if they will cover her labs first. She will be loosing insurance on 3/26.

## 2020-02-16 NOTE — Telephone Encounter (Signed)
Lipid, liver, metabolic 7, vitamin D, CBC Hypertension hyperlipidemia vitamin D deficiency leukopenia

## 2020-02-16 NOTE — Telephone Encounter (Signed)
Blood work ordered in Epic. Patient notified. 

## 2020-02-16 NOTE — Telephone Encounter (Signed)
Last labs ordered 04/2019 but not completed : Lipid, Met 7 and Vit D

## 2020-02-18 LAB — BASIC METABOLIC PANEL
BUN/Creatinine Ratio: 11 (ref 9–23)
BUN: 12 mg/dL (ref 6–24)
CO2: 30 mmol/L — ABNORMAL HIGH (ref 20–29)
Calcium: 9.8 mg/dL (ref 8.7–10.2)
Chloride: 103 mmol/L (ref 96–106)
Creatinine, Ser: 1.06 mg/dL — ABNORMAL HIGH (ref 0.57–1.00)
GFR calc Af Amer: 66 mL/min/{1.73_m2} (ref >59)
GFR calc non Af Amer: 58 mL/min/{1.73_m2} — ABNORMAL LOW (ref >59)
Glucose: 98 mg/dL (ref 65–99)
Potassium: 3.7 mmol/L (ref 3.5–5.2)
Sodium: 147 mmol/L — ABNORMAL HIGH (ref 134–144)

## 2020-02-18 LAB — LIPID PANEL
Chol/HDL Ratio: 3.4 ratio (ref 0.0–4.4)
Cholesterol, Total: 284 mg/dL — ABNORMAL HIGH (ref 100–199)
HDL: 83 mg/dL (ref >39)
LDL Chol Calc (NIH): 190 mg/dL — ABNORMAL HIGH (ref 0–99)
Triglycerides: 70 mg/dL (ref 0–149)
VLDL Cholesterol Cal: 11 mg/dL (ref 5–40)

## 2020-02-18 LAB — HEPATIC FUNCTION PANEL
ALT: 19 IU/L (ref 0–32)
AST: 24 IU/L (ref 0–40)
Albumin: 4.3 g/dL (ref 3.8–4.9)
Alkaline Phosphatase: 61 IU/L (ref 39–117)
Bilirubin Total: 0.3 mg/dL (ref 0.0–1.2)
Bilirubin, Direct: 0.08 mg/dL (ref 0.00–0.40)
Total Protein: 8 g/dL (ref 6.0–8.5)

## 2020-02-18 LAB — CBC WITH DIFFERENTIAL/PLATELET
Basophils Absolute: 0.1 10*3/uL (ref 0.0–0.2)
Basos: 2 %
EOS (ABSOLUTE): 0.1 10*3/uL (ref 0.0–0.4)
Eos: 4 %
Hematocrit: 42.3 % (ref 34.0–46.6)
Hemoglobin: 13.6 g/dL (ref 11.1–15.9)
Immature Grans (Abs): 0 10*3/uL (ref 0.0–0.1)
Immature Granulocytes: 0 %
Lymphocytes Absolute: 1.4 10*3/uL (ref 0.7–3.1)
Lymphs: 48 %
MCH: 29.2 pg (ref 26.6–33.0)
MCHC: 32.2 g/dL (ref 31.5–35.7)
MCV: 91 fL (ref 79–97)
Monocytes Absolute: 0.2 10*3/uL (ref 0.1–0.9)
Monocytes: 8 %
Neutrophils Absolute: 1.1 10*3/uL — ABNORMAL LOW (ref 1.4–7.0)
Neutrophils: 38 %
Platelets: 267 10*3/uL (ref 150–450)
RBC: 4.65 x10E6/uL (ref 3.77–5.28)
RDW: 14.5 % (ref 11.7–15.4)
WBC: 2.9 10*3/uL — ABNORMAL LOW (ref 3.4–10.8)

## 2020-02-18 LAB — VITAMIN D 25 HYDROXY (VIT D DEFICIENCY, FRACTURES): Vit D, 25-Hydroxy: 15.4 ng/mL — ABNORMAL LOW (ref 30.0–100.0)

## 2020-02-19 ENCOUNTER — Ambulatory Visit (INDEPENDENT_AMBULATORY_CARE_PROVIDER_SITE_OTHER): Payer: Managed Care, Other (non HMO) | Admitting: Family Medicine

## 2020-02-19 ENCOUNTER — Encounter: Payer: Self-pay | Admitting: Family Medicine

## 2020-02-19 ENCOUNTER — Other Ambulatory Visit: Payer: Self-pay

## 2020-02-19 DIAGNOSIS — I10 Essential (primary) hypertension: Secondary | ICD-10-CM

## 2020-02-19 DIAGNOSIS — E785 Hyperlipidemia, unspecified: Secondary | ICD-10-CM

## 2020-02-19 DIAGNOSIS — Z566 Other physical and mental strain related to work: Secondary | ICD-10-CM

## 2020-02-19 DIAGNOSIS — E559 Vitamin D deficiency, unspecified: Secondary | ICD-10-CM

## 2020-02-19 NOTE — Progress Notes (Signed)
Subjective:    Patient ID: [name], female    DOB: 01-Sep-1961, 59 y.o.   MRN: [id number]  Hypertension This is a chronic problem. Pertinent negatives include no chest pain, headaches or shortness of breath. There are no compliance problems.    Would like to go over bloodwork results.   Anxiety. Gad 7 done. Pt under a lot of stress since losing her job.  GAD 7 : Generalized Anxiety Score 02/19/2020 05/21/2018  Nervous, Anxious, on Edge 3 2  Control/stop worrying 3 3  Worry too much - different things 3 3  Trouble relaxing 3 2  Restless 1 0  Easily annoyed or irritable 3 0  Afraid - awful might happen 3 0  Total GAD 7 Score 19 10  Anxiety Difficulty Somewhat difficult Somewhat difficult   Patient unfortunately lost her job is caused her significant amount of anxiety and anxiousness as well as uncertainty.  She did not feel she did anything to trigger her job loss and states that her job was eliminated but she feels that she was being removed for not a good reason.  She is looking into this aspect further.  She is trying to do the best she can of moving forward but finds her self feeling nervous anxious difficult time focusing and staying on track and does not feel that she is able to work currently she is doing counseling.  Virtual Visit via Telephone Note  I connected with [name] on 02/19/20 at 10:00 AM EDT by telephone and verified that I am speaking with the correct person using two identifiers.  Location: Patient: home Provider: office   I discussed the limitations, risks, security and privacy concerns of performing an evaluation and management service by telephone and the availability of in person appointments. I also discussed with the patient that there may be a patient responsible charge related to this service. The patient expressed understanding and agreed to proceed.   History of Present Illness:    Observations/Objective:   Assessment and  Plan:   Follow Up Instructions:    I discussed the assessment and treatment plan with the patient. The patient was provided an opportunity to ask questions and all were answered. The patient agreed with the plan and demonstrated an understanding of the instructions.   The patient was advised to call back or seek an in-person evaluation if the symptoms worsen or if the condition fails to improve as anticipated.  I provided 24 minutes of non-face-to-face time during this encounter.     Review of Systems  Constitutional: Negative for activity change, fatigue and fever.  HENT: Negative for congestion and rhinorrhea.   Respiratory: Negative for cough, chest tightness and shortness of breath.   Cardiovascular: Negative for chest pain and leg swelling.  Gastrointestinal: Negative for abdominal pain and nausea.  Skin: Negative for color change.  Neurological: Negative for dizziness and headaches.  Psychiatric/Behavioral: Negative for agitation and behavioral problems.       Objective:   Physical Exam  Virtual exam unable to do physical exam      Assessment & Plan:  1. Hyperlipidemia, unspecified hyperlipidemia type Patient with significant elevation of LDL but her HDL is excellent her risk of heart disease is low she will work hard on diet she will repeat her lab work again by later this summer in 3 months if it is under good control at that point no need for statin need to get LDL closer to 160  2.  HTN (hypertension), benign Patient relates blood pressure under good control slight elevation of creatinine encourage her to minimize protein healthy diet regular activity keep blood pressure under good control  3. Vitamin D deficiency Vitamin D deficiency 1000 units IU on a daily basis relook at this again in several months  4. Stress at work Patient relates that she had significant troubles with stress at work related to lack of inclusion and having several things going on that  caused her to be excluded and then she was recently released from work for no specific reason since then is caused her to feel very anxious nervous and suffering with feelings of uncertainty as well not suicidal.  Patient is going through counseling currently.  After long discussion she does not feel that she needs to be on any antidepressant uses Xanax occasionally.  She is going to continue the intensive counseling She is not at a point currently where she is capable or able to work.  I recommended she take the next 3 months to work hard on counseling and getting her self through this then may resume into working Patient should send [place] an update in several weeks how she is doing If the counselor feels she needs to be on an antidepressant the patient is to let us know

## 2020-02-26 ENCOUNTER — Encounter: Payer: Self-pay | Admitting: Family Medicine

## 2020-02-26 ENCOUNTER — Telehealth: Payer: Self-pay | Admitting: Family Medicine

## 2020-02-26 NOTE — Telephone Encounter (Signed)
I dictated a letter for this particular patient  Please print the letter and let me sign it then please mail the letter to the patient thank you

## 2020-07-01 ENCOUNTER — Other Ambulatory Visit: Payer: Self-pay | Admitting: Family Medicine

## 2020-08-25 DIAGNOSIS — E559 Vitamin D deficiency, unspecified: Secondary | ICD-10-CM | POA: Insufficient documentation

## 2020-09-28 ENCOUNTER — Telehealth: Payer: Self-pay

## 2020-09-28 DIAGNOSIS — E785 Hyperlipidemia, unspecified: Secondary | ICD-10-CM

## 2020-09-28 DIAGNOSIS — I10 Essential (primary) hypertension: Secondary | ICD-10-CM

## 2020-09-28 DIAGNOSIS — E559 Vitamin D deficiency, unspecified: Secondary | ICD-10-CM

## 2020-09-28 DIAGNOSIS — Z79899 Other long term (current) drug therapy: Secondary | ICD-10-CM

## 2020-09-28 NOTE — Telephone Encounter (Signed)
CBC, lipid, metabolic 7, vitamin D, urine ACR  Renal insufficiency hyperlipidemia leukopenia vitamin D deficiency

## 2020-09-28 NOTE — Telephone Encounter (Signed)
Last labs completed 3//24/21 CBC, Vit D, BMET, Hepatic and Lipid. Please advise. Thank you

## 2020-09-28 NOTE — Telephone Encounter (Signed)
Lab orders placed. Left message to return call  

## 2020-09-28 NOTE — Telephone Encounter (Signed)
Pt has Phy scheduled for 12/17 needs blood work done?

## 2020-09-28 NOTE — Telephone Encounter (Addendum)
Patient notified and verbalized understanding. 

## 2020-11-08 LAB — COMPREHENSIVE METABOLIC PANEL
ALT: 29 IU/L (ref 0–32)
AST: 28 IU/L (ref 0–40)
Albumin/Globulin Ratio: 1.3 (ref 1.2–2.2)
Albumin: 4.3 g/dL (ref 3.8–4.9)
Alkaline Phosphatase: 57 IU/L (ref 44–121)
BUN/Creatinine Ratio: 16 (ref 12–28)
BUN: 14 mg/dL (ref 8–27)
Bilirubin Total: 0.3 mg/dL (ref 0.0–1.2)
CO2: 26 mmol/L (ref 20–29)
Calcium: 9.1 mg/dL (ref 8.7–10.3)
Chloride: 104 mmol/L (ref 96–106)
Creatinine, Ser: 0.87 mg/dL (ref 0.57–1.00)
GFR calc Af Amer: 84 mL/min/{1.73_m2} (ref >59)
GFR calc non Af Amer: 73 mL/min/{1.73_m2} (ref >59)
Globulin, Total: 3.3 g/dL (ref 1.5–4.5)
Glucose: 92 mg/dL (ref 65–99)
Potassium: 3.6 mmol/L (ref 3.5–5.2)
Sodium: 142 mmol/L (ref 134–144)
Total Protein: 7.6 g/dL (ref 6.0–8.5)

## 2020-11-08 LAB — VITAMIN D 25 HYDROXY (VIT D DEFICIENCY, FRACTURES): Vit D, 25-Hydroxy: 42.5 ng/mL (ref 30.0–100.0)

## 2020-11-08 LAB — CBC WITH DIFFERENTIAL/PLATELET
Basophils Absolute: 0.1 10*3/uL (ref 0.0–0.2)
Basos: 1 %
EOS (ABSOLUTE): 0.1 10*3/uL (ref 0.0–0.4)
Eos: 3 %
Hematocrit: 39.7 % (ref 34.0–46.6)
Hemoglobin: 13.3 g/dL (ref 11.1–15.9)
Immature Grans (Abs): 0 10*3/uL (ref 0.0–0.1)
Immature Granulocytes: 0 %
Lymphocytes Absolute: 1.6 10*3/uL (ref 0.7–3.1)
Lymphs: 45 %
MCH: 29.8 pg (ref 26.6–33.0)
MCHC: 33.5 g/dL (ref 31.5–35.7)
MCV: 89 fL (ref 79–97)
Monocytes Absolute: 0.3 10*3/uL (ref 0.1–0.9)
Monocytes: 7 %
Neutrophils Absolute: 1.6 10*3/uL (ref 1.4–7.0)
Neutrophils: 44 %
Platelets: 254 10*3/uL (ref 150–450)
RBC: 4.47 x10E6/uL (ref 3.77–5.28)
RDW: 14.3 % (ref 11.7–15.4)
WBC: 3.7 10*3/uL (ref 3.4–10.8)

## 2020-11-08 LAB — LIPID PANEL
Chol/HDL Ratio: 2.5 ratio (ref 0.0–4.4)
Cholesterol, Total: 196 mg/dL (ref 100–199)
HDL: 77 mg/dL (ref >39)
LDL Chol Calc (NIH): 108 mg/dL — ABNORMAL HIGH (ref 0–99)
Triglycerides: 61 mg/dL (ref 0–149)
VLDL Cholesterol Cal: 11 mg/dL (ref 5–40)

## 2020-11-08 LAB — MICROALBUMIN / CREATININE URINE RATIO
Creatinine, Urine: 64.3 mg/dL
Microalb/Creat Ratio: 5 mg/g creat (ref 0–29)
Microalbumin, Urine: 3 ug/mL

## 2020-11-11 ENCOUNTER — Other Ambulatory Visit: Payer: Self-pay

## 2020-11-11 ENCOUNTER — Encounter: Payer: Self-pay | Admitting: Family Medicine

## 2020-11-11 ENCOUNTER — Ambulatory Visit (INDEPENDENT_AMBULATORY_CARE_PROVIDER_SITE_OTHER): Payer: 59 | Admitting: Family Medicine

## 2020-11-11 VITALS — BP 128/76 | HR 82 | Temp 97.3°F | Ht 66.0 in | Wt 152.0 lb

## 2020-11-11 DIAGNOSIS — E785 Hyperlipidemia, unspecified: Secondary | ICD-10-CM | POA: Diagnosis not present

## 2020-11-11 DIAGNOSIS — Z0000 Encounter for general adult medical examination without abnormal findings: Secondary | ICD-10-CM | POA: Diagnosis not present

## 2020-11-11 DIAGNOSIS — Z8639 Personal history of other endocrine, nutritional and metabolic disease: Secondary | ICD-10-CM | POA: Diagnosis not present

## 2020-11-11 MED ORDER — POTASSIUM CHLORIDE ER 10 MEQ PO TBCR: EXTENDED_RELEASE_TABLET | ORAL | 1 refills | Status: DC

## 2020-11-11 MED ORDER — ATORVASTATIN CALCIUM 10 MG PO TABS: ORAL_TABLET | ORAL | 1 refills | Status: DC

## 2020-11-11 MED ORDER — VALACYCLOVIR HCL 500 MG PO TABS: 500.0000 mg | ORAL_TABLET | Freq: Two times a day (BID) | ORAL | 5 refills | Status: DC

## 2020-11-11 NOTE — Progress Notes (Signed)
Subjective:    Patient ID: [name], female    DOB: 14-May-1961, 59 y.o.   MRN: [id number]  HPI The patient comes in today for a wellness visit.  Patient tries to watch her diet She tries to fit in exercise on a regular basis Currently is in the process of looking for the possibility of working a different job the past 2 jobs she was in and had negative experiences She was very stressed and traumatized by these experiences and she is gradually figuring out what to do next  A review of their health history was completed.  A review of medications was also completed.  Any needed refills; update meds  Eating habits: health conscious  Falls/  MVA accidents in past few months: none  Regular exercise: walks daily  Specialist pt sees on regular basis: ob/gyn for pelvic and breast exam.   Preventative health issues were discussed.   Additional concerns: none    Review of Systems  Constitutional: Negative for activity change, appetite change and fatigue.  HENT: Negative for congestion and rhinorrhea.   Eyes: Negative for discharge.  Respiratory: Negative for cough, chest tightness and wheezing.   Cardiovascular: Negative for chest pain.  Gastrointestinal: Negative for abdominal pain, blood in stool and vomiting.  Endocrine: Negative for polyphagia.  Genitourinary: Negative for difficulty urinating and frequency.  Musculoskeletal: Negative for neck pain.  Skin: Negative for color change.  Allergic/Immunologic: Negative for environmental allergies and food allergies.  Neurological: Negative for weakness and headaches.  Psychiatric/Behavioral: Negative for agitation and behavioral problems.       Objective:   Physical Exam Constitutional:      Appearance: She is well-developed and well-nourished.  HENT:     Head: Normocephalic.     Right Ear: External ear normal.     Left Ear: External ear normal.  Eyes:     Pupils: Pupils are equal, round, and reactive to light.   Neck:     Thyroid: No thyromegaly.  Cardiovascular:     Rate and Rhythm: Normal rate and regular rhythm.     Pulses: Intact distal pulses.     Heart sounds: Normal heart sounds. No murmur heard.   Pulmonary:     Effort: Pulmonary effort is normal. No respiratory distress.     Breath sounds: Normal breath sounds. No wheezing.  Abdominal:     General: Bowel sounds are normal. There is no distension.     Palpations: Abdomen is soft. There is no mass.     Tenderness: There is no abdominal tenderness.  Musculoskeletal:        General: No tenderness or edema. Normal range of motion.     Cervical back: Normal range of motion.  Lymphadenopathy:     Cervical: No cervical adenopathy.  Skin:    General: Skin is warm and dry.  Neurological:     Mental Status: She is alert and oriented to person, place, and time.     Motor: No abnormal muscle tone.  Psychiatric:        Mood and Affect: Mood and affect normal.        Behavior: Behavior normal.            Assessment & Plan:  She will get a copy of the mammogram sent to [place]  Adult wellness-complete.wellness physical was conducted today. Importance of diet and exercise were discussed in detail.  In addition to this a discussion regarding safety was also covered. We also reviewed over immunizations  and gave recommendations regarding current immunization needed for age.  In addition to this additional areas were also touched on including: Preventative health exams needed:  Colonoscopy she is good for a couple more years  Patient was advised yearly wellness exam Has hyperlipidemia continue current medication LDL goal is below 100 patient will work hard on diet does not want to go up on medicine currently  Patient's blood pressure doing great off of medication no need for further meds  Potassium 3.6 continue potassium  Follow-up on cholesterol in 6 months

## 2020-11-12 ENCOUNTER — Encounter: Payer: Self-pay | Admitting: Family Medicine

## 2020-11-14 ENCOUNTER — Other Ambulatory Visit: Payer: Self-pay | Admitting: Family Medicine

## 2020-11-16 ENCOUNTER — Telehealth: Payer: Self-pay

## 2020-11-16 NOTE — Telephone Encounter (Signed)
Patient called to see if we got the form she sent through MyChart for her Physical.  Form printed and put in Dr. Bary Leriche folder for completion.

## 2020-11-17 NOTE — Telephone Encounter (Signed)
Hi Lacye  Your form has been filled out as requested.  The front staff is handling sending it into your insurance company thank you  Have a Merry Christmas [facility]-Dr. Nicki Reaper

## 2020-11-17 NOTE — Telephone Encounter (Signed)
The form was completed the patient asked that this be sent back to her via MyChart if possible please connect with her and get the form back to her thank you

## 2020-12-26 IMAGING — CT CT HEAD W/O CM
1 series · 16 of 30 positions shown, 20 images · non-contrast
Comparison: 07/04/2008

CLINICAL DATA: Headache

EXAM:
CT HEAD WITHOUT CONTRAST
TECHNIQUE: Contiguous axial images were obtained from the base of the skull
through the vertex without intravenous contrast.

[Series 2: head w/(date) · axial · 0.43mm/px · z∈[-151,-11]mm · 16 of 32 slices shown, 20 images]
[im 2/32  brain]
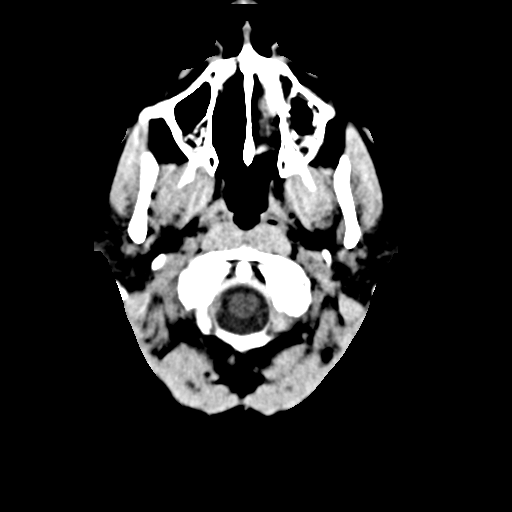
[im 2/32  bone]
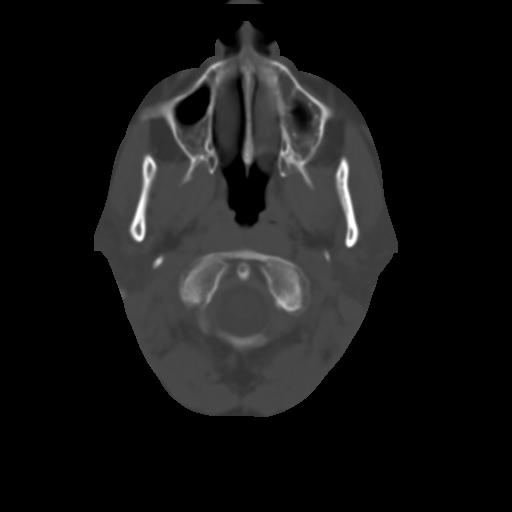
[im 4/32  brain]
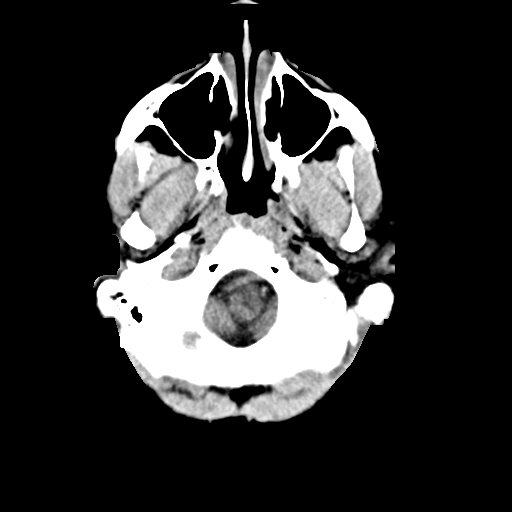
[im 6/32  brain]
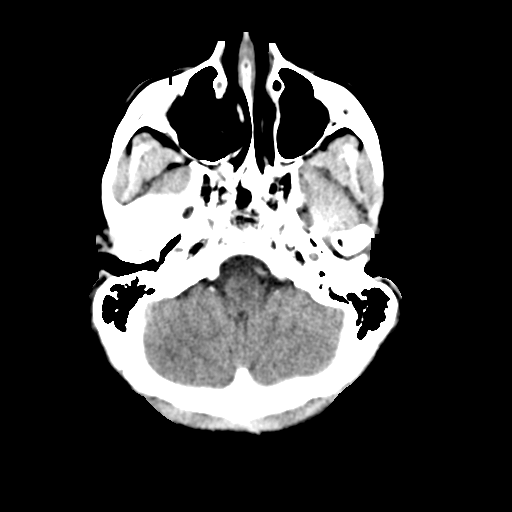
[im 8/32  brain]
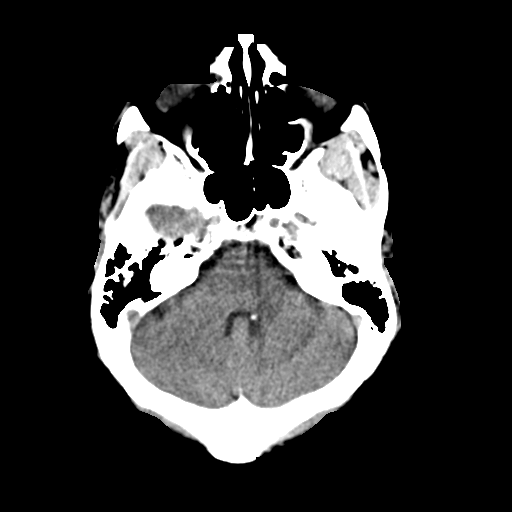
[im 9/32  brain]
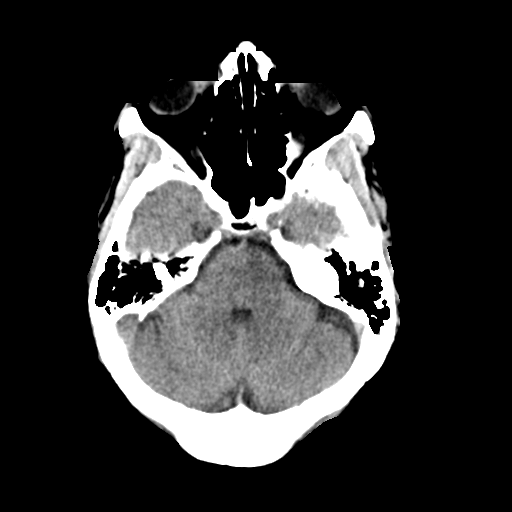
[im 9/32  bone]
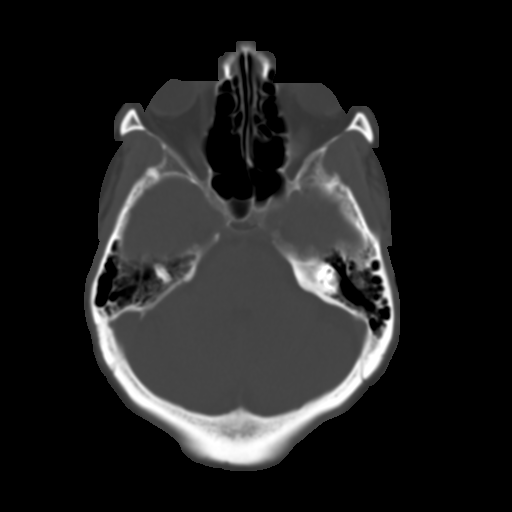
[im 11/32  brain]
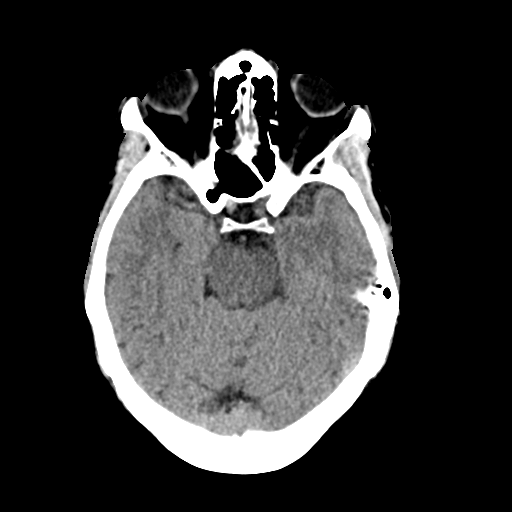
[im 13/32  brain]
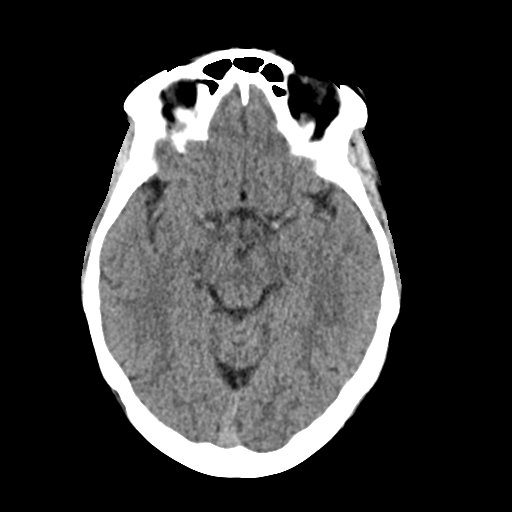
[im 15/32  brain]
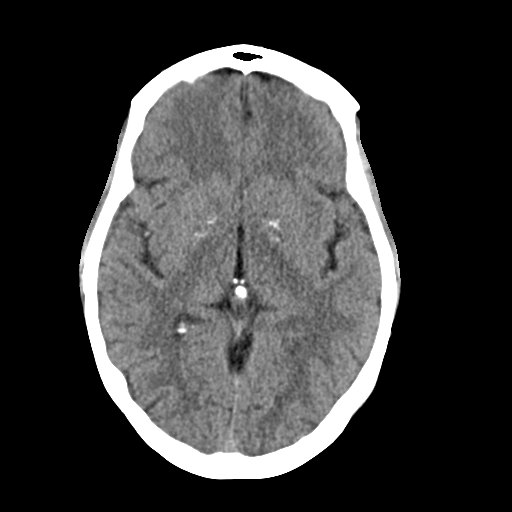
[im 17/32  brain]
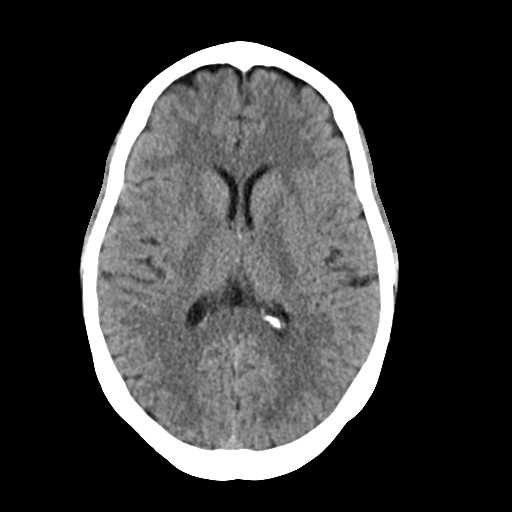
[im 17/32  bone]
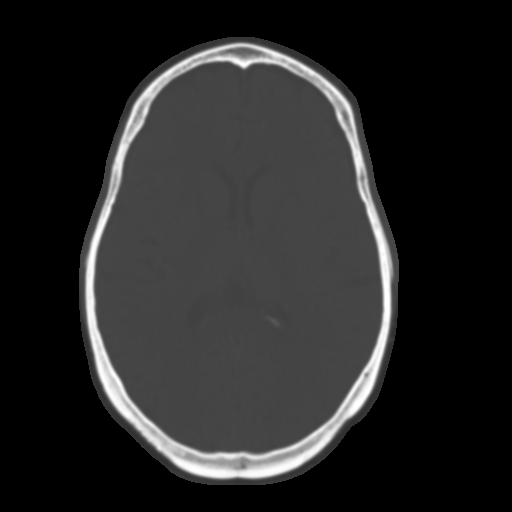
[im 19/32  brain]
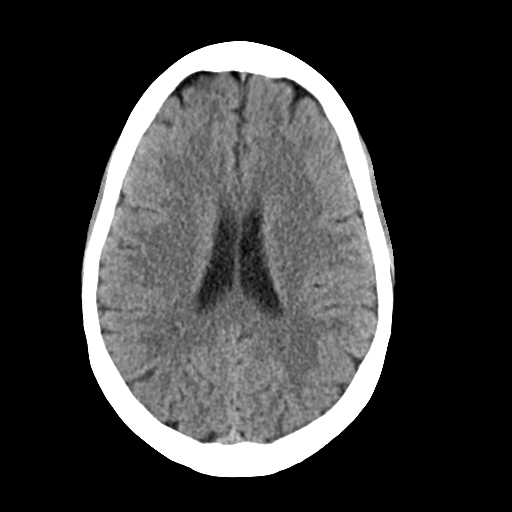
[im 21/32  brain]
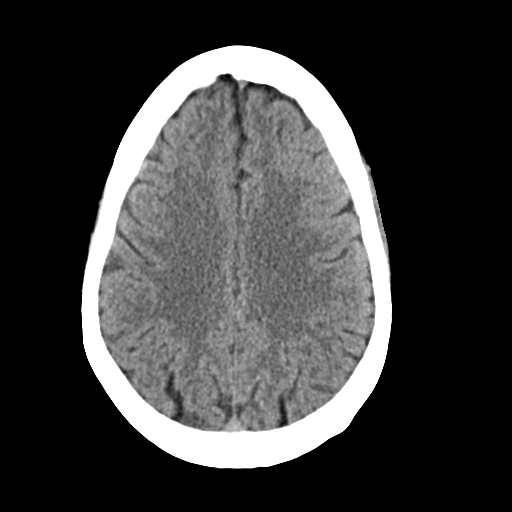
[im 23/32  brain]
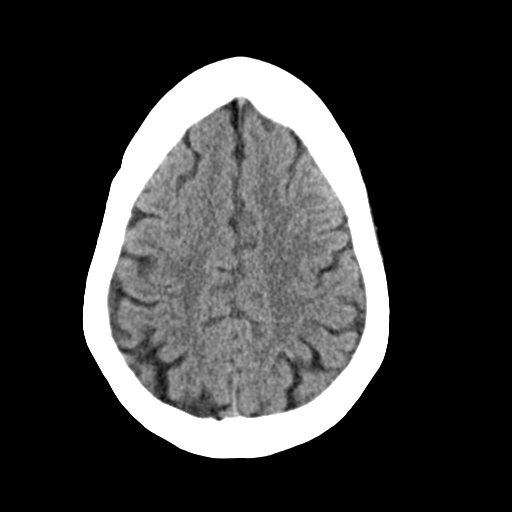
[im 24/32  brain]
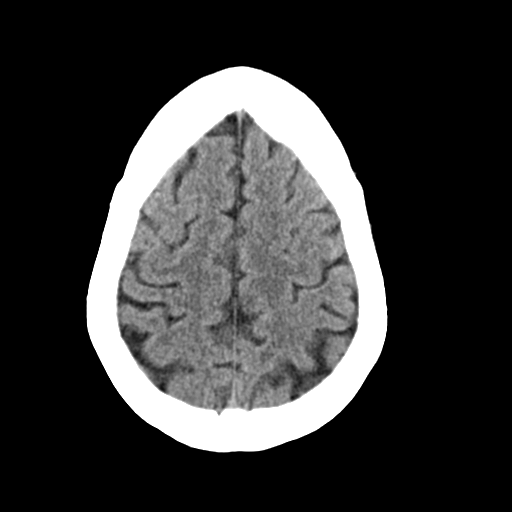
[im 24/32  bone]
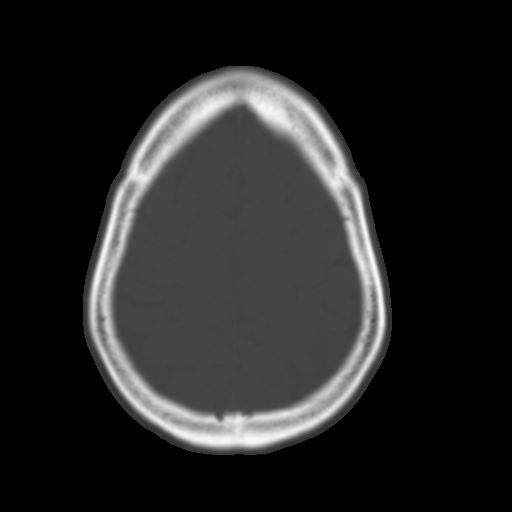
[im 26/32  brain]
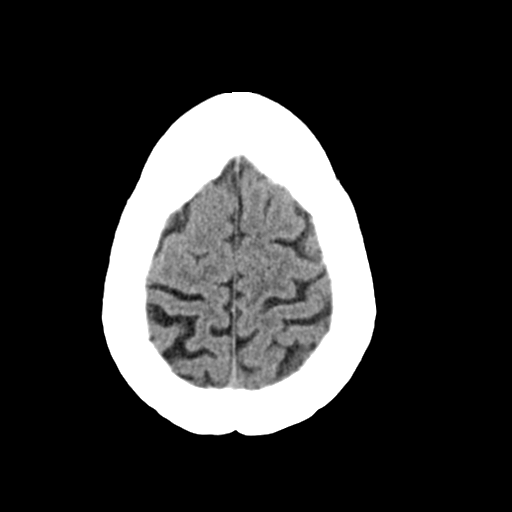
[im 28/32  brain]
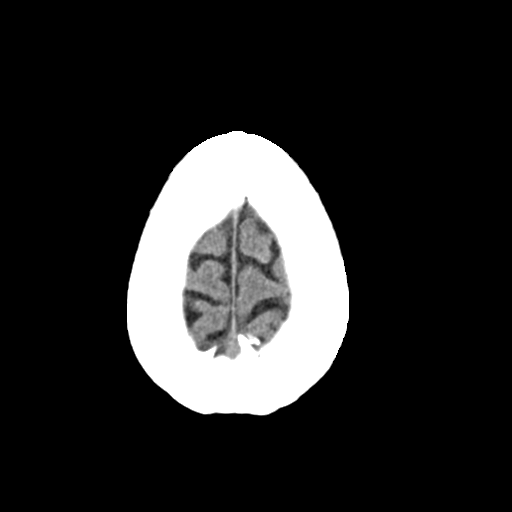
[im 30/32  brain]
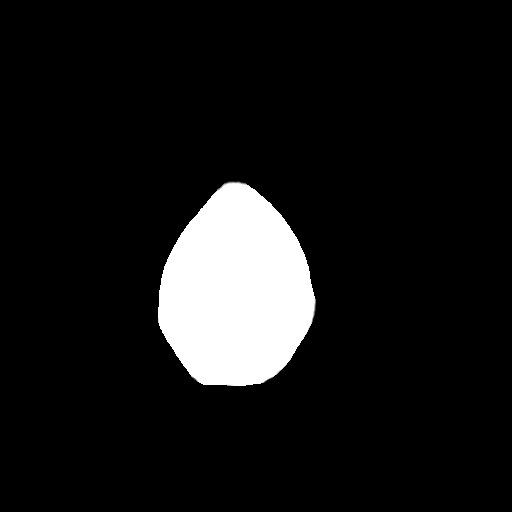

[16 of 30 positions shown; findings below may reference images not displayed]

FINDINGS: Brain: No evidence of acute infarction, hemorrhage, hydrocephalus,
extra-axial collection or mass lesion/mass effect.

Vascular: No hyperdense vessel or unexpected calcification.

Skull: Normal. Negative for fracture or focal lesion.

Sinuses/Orbits: Small sinonasal polyp versus mucous retention cyst
within the left maxillary sinus. Paranasal sinuses and mastoid air
cells otherwise clear. Orbital structures unremarkable.

Other: None.
IMPRESSION: No acute intracranial findings.

## 2021-05-09 ENCOUNTER — Ambulatory Visit: Payer: 59 | Admitting: Family Medicine

## 2021-05-09 ENCOUNTER — Telehealth: Payer: Self-pay

## 2021-05-09 DIAGNOSIS — E785 Hyperlipidemia, unspecified: Secondary | ICD-10-CM

## 2021-05-09 DIAGNOSIS — Z79899 Other long term (current) drug therapy: Secondary | ICD-10-CM

## 2021-05-09 DIAGNOSIS — I10 Essential (primary) hypertension: Secondary | ICD-10-CM

## 2021-05-09 DIAGNOSIS — Z8639 Personal history of other endocrine, nutritional and metabolic disease: Secondary | ICD-10-CM

## 2021-05-09 NOTE — Telephone Encounter (Signed)
Lipid, liver, metabolic 7-hyperlipidemia and history of renal insufficiency Please be well-hydrated when she goes to get her blood drawn thank you

## 2021-05-09 NOTE — Telephone Encounter (Signed)
Pt has follow up appt on 07/11 for cholesterol will she need blood work for this?   Pt call back [phone]

## 2021-05-09 NOTE — Telephone Encounter (Signed)
Last labs completed 11/07/20 Urine Microalbumin, Vit D, CMP, Lipid and CBC. Please advise. Thank you

## 2021-05-10 ENCOUNTER — Ambulatory Visit: Payer: 59 | Admitting: Family Medicine

## 2021-05-10 NOTE — Telephone Encounter (Signed)
Blood work ordered in [employer]. Patient notified and advised to be well hydrated for lab work. Patient verbalized understanding.

## 2021-06-05 ENCOUNTER — Ambulatory Visit: Payer: 59 | Admitting: Family Medicine

## 2021-06-09 ENCOUNTER — Ambulatory Visit: Payer: 59 | Admitting: Nurse Practitioner

## 2021-07-05 ENCOUNTER — Ambulatory Visit: Payer: 59 | Admitting: Family Medicine

## 2021-07-08 LAB — BASIC METABOLIC PANEL
BUN/Creatinine Ratio: 16 (ref 12–28)
BUN: 15 mg/dL (ref 8–27)
CO2: 27 mmol/L (ref 20–29)
Calcium: 9.4 mg/dL (ref 8.7–10.3)
Chloride: 102 mmol/L (ref 96–106)
Creatinine, Ser: 0.92 mg/dL (ref 0.57–1.00)
Glucose: 96 mg/dL (ref 65–99)
Potassium: 3.6 mmol/L (ref 3.5–5.2)
Sodium: 142 mmol/L (ref 134–144)
eGFR: 71 mL/min/{1.73_m2} (ref >59)

## 2021-07-08 LAB — HEPATIC FUNCTION PANEL
ALT: 25 IU/L (ref 0–32)
AST: 29 IU/L (ref 0–40)
Albumin: 4.3 g/dL (ref 3.8–4.9)
Alkaline Phosphatase: 73 IU/L (ref 44–121)
Bilirubin Total: 0.3 mg/dL (ref 0.0–1.2)
Bilirubin, Direct: <0.1 mg/dL (ref 0.00–0.40)
Total Protein: 7.4 g/dL (ref 6.0–8.5)

## 2021-07-08 LAB — LIPID PANEL
Chol/HDL Ratio: 2.6 ratio (ref 0.0–4.4)
Cholesterol, Total: 202 mg/dL — ABNORMAL HIGH (ref 100–199)
HDL: 77 mg/dL (ref >39)
LDL Chol Calc (NIH): 115 mg/dL — ABNORMAL HIGH (ref 0–99)
Triglycerides: 55 mg/dL (ref 0–149)
VLDL Cholesterol Cal: 10 mg/dL (ref 5–40)

## 2021-08-23 ENCOUNTER — Encounter: Payer: Self-pay | Admitting: Family Medicine

## 2021-08-23 ENCOUNTER — Telehealth: Payer: Self-pay | Admitting: Family Medicine

## 2021-08-23 NOTE — Telephone Encounter (Signed)
Nurse- patient is traveling out of [place] and wanting to make sure she was updated on her shots. Please advise

## 2021-08-24 NOTE — Telephone Encounter (Signed)
MyChart message was sent.

## 2021-10-06 ENCOUNTER — Other Ambulatory Visit: Payer: Self-pay

## 2021-10-06 ENCOUNTER — Emergency Department (HOSPITAL_BASED_OUTPATIENT_CLINIC_OR_DEPARTMENT_OTHER)
Admission: EM | Admit: 2021-10-06 | Discharge: 2021-10-06 | Disposition: A | Discharge status: Home / Self Care | Payer: 59 | Attending: Emergency Medicine | Admitting: Emergency Medicine

## 2021-10-06 ENCOUNTER — Encounter (HOSPITAL_BASED_OUTPATIENT_CLINIC_OR_DEPARTMENT_OTHER): Payer: Self-pay | Admitting: *Deleted

## 2021-10-06 DIAGNOSIS — Y92410 Unspecified street and highway as the place of occurrence of the external cause: Secondary | ICD-10-CM | POA: Insufficient documentation

## 2021-10-06 DIAGNOSIS — M549 Dorsalgia, unspecified: Secondary | ICD-10-CM | POA: Diagnosis not present

## 2021-10-06 DIAGNOSIS — I10 Essential (primary) hypertension: Secondary | ICD-10-CM | POA: Diagnosis not present

## 2021-10-06 DIAGNOSIS — R519 Headache, unspecified: Secondary | ICD-10-CM

## 2021-10-06 DIAGNOSIS — M542 Cervicalgia: Secondary | ICD-10-CM | POA: Diagnosis not present

## 2021-10-06 DIAGNOSIS — M25511 Pain in right shoulder: Secondary | ICD-10-CM | POA: Insufficient documentation

## 2021-10-06 DIAGNOSIS — M7918 Myalgia, other site: Secondary | ICD-10-CM

## 2021-10-06 DIAGNOSIS — S0990XA Unspecified injury of head, initial encounter: Secondary | ICD-10-CM | POA: Insufficient documentation

## 2021-10-06 DIAGNOSIS — M25512 Pain in left shoulder: Secondary | ICD-10-CM | POA: Diagnosis not present

## 2021-10-06 MED ORDER — METHOCARBAMOL 500 MG PO TABS: 1000.0000 mg | ORAL_TABLET | Freq: Four times a day (QID) | ORAL | 0 refills | Status: DC
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
## 2021-11-10 ENCOUNTER — Telehealth: Payer: Self-pay | Admitting: Family Medicine

## 2021-11-13 ENCOUNTER — Telehealth: Payer: Self-pay | Admitting: Family Medicine

## 2021-11-13 NOTE — Telephone Encounter (Signed)
Has appointment on 11/16/21 for physical

## 2021-11-13 NOTE — Telephone Encounter (Signed)
Patient has appointment on 12/22 for physical and needing labs

## 2021-11-15 ENCOUNTER — Other Ambulatory Visit: Payer: Self-pay | Admitting: *Deleted

## 2021-11-15 ENCOUNTER — Other Ambulatory Visit: Payer: Self-pay

## 2021-11-15 DIAGNOSIS — Z131 Encounter for screening for diabetes mellitus: Secondary | ICD-10-CM

## 2021-11-15 DIAGNOSIS — D72819 Decreased white blood cell count, unspecified: Secondary | ICD-10-CM

## 2021-11-15 DIAGNOSIS — E785 Hyperlipidemia, unspecified: Secondary | ICD-10-CM

## 2021-11-15 DIAGNOSIS — E559 Vitamin D deficiency, unspecified: Secondary | ICD-10-CM

## 2021-11-15 DIAGNOSIS — Z79899 Other long term (current) drug therapy: Secondary | ICD-10-CM

## 2021-11-15 DIAGNOSIS — I10 Essential (primary) hypertension: Secondary | ICD-10-CM

## 2021-11-16 ENCOUNTER — Encounter: Payer: 59 | Admitting: Nurse Practitioner

## 2021-11-16 LAB — CBC WITH DIFFERENTIAL/PLATELET
Basophils Absolute: 0.1 10*3/uL (ref 0.0–0.2)
Basos: 2 %
EOS (ABSOLUTE): 0.3 10*3/uL (ref 0.0–0.4)
Eos: 9 %
Hematocrit: 41.2 % (ref 34.0–46.6)
Hemoglobin: 13.5 g/dL (ref 11.1–15.9)
Immature Grans (Abs): 0 10*3/uL (ref 0.0–0.1)
Immature Granulocytes: 0 %
Lymphocytes Absolute: 1 10*3/uL (ref 0.7–3.1)
Lymphs: 35 %
MCH: 29.9 pg (ref 26.6–33.0)
MCHC: 32.8 g/dL (ref 31.5–35.7)
MCV: 91 fL (ref 79–97)
Monocytes Absolute: 0.3 10*3/uL (ref 0.1–0.9)
Monocytes: 11 %
Neutrophils Absolute: 1.3 10*3/uL — ABNORMAL LOW (ref 1.4–7.0)
Neutrophils: 43 %
Platelets: 236 10*3/uL (ref 150–450)
RBC: 4.52 x10E6/uL (ref 3.77–5.28)
RDW: 13.4 % (ref 11.7–15.4)
WBC: 3 10*3/uL — ABNORMAL LOW (ref 3.4–10.8)

## 2021-11-16 LAB — LIPID PANEL
Chol/HDL Ratio: 2.7 ratio (ref 0.0–4.4)
Cholesterol, Total: 197 mg/dL (ref 100–199)
HDL: 73 mg/dL (ref >39)
LDL Chol Calc (NIH): 114 mg/dL — ABNORMAL HIGH (ref 0–99)
Triglycerides: 56 mg/dL (ref 0–149)
VLDL Cholesterol Cal: 10 mg/dL (ref 5–40)

## 2021-11-16 LAB — VITAMIN D 25 HYDROXY (VIT D DEFICIENCY, FRACTURES): Vit D, 25-Hydroxy: 38.1 ng/mL (ref 30.0–100.0)

## 2021-11-16 LAB — HEMOGLOBIN A1C
Est. average glucose Bld gHb Est-mCnc: 126 mg/dL
Hgb A1c MFr Bld: 6 % — ABNORMAL HIGH (ref 4.8–5.6)

## 2021-11-16 LAB — GLUCOSE, RANDOM: Glucose: 95 mg/dL (ref 70–99)

## 2021-11-22 ENCOUNTER — Other Ambulatory Visit: Payer: Self-pay

## 2021-11-22 ENCOUNTER — Ambulatory Visit (INDEPENDENT_AMBULATORY_CARE_PROVIDER_SITE_OTHER): Payer: 59 | Admitting: Nurse Practitioner

## 2021-11-22 ENCOUNTER — Encounter: Payer: Self-pay | Admitting: Nurse Practitioner

## 2021-11-22 VITALS — BP 124/85 | HR 68 | Temp 97.8°F | Ht 66.0 in | Wt 147.4 lb

## 2021-11-22 DIAGNOSIS — Z780 Asymptomatic menopausal state: Secondary | ICD-10-CM | POA: Insufficient documentation

## 2021-11-22 DIAGNOSIS — F411 Generalized anxiety disorder: Secondary | ICD-10-CM | POA: Diagnosis not present

## 2021-11-22 DIAGNOSIS — E785 Hyperlipidemia, unspecified: Secondary | ICD-10-CM

## 2021-11-22 DIAGNOSIS — Z0001 Encounter for general adult medical examination with abnormal findings: Secondary | ICD-10-CM | POA: Diagnosis not present

## 2021-11-22 DIAGNOSIS — R7303 Prediabetes: Secondary | ICD-10-CM

## 2021-11-22 DIAGNOSIS — B009 Herpesviral infection, unspecified: Secondary | ICD-10-CM

## 2021-11-22 DIAGNOSIS — Z0000 Encounter for general adult medical examination without abnormal findings: Secondary | ICD-10-CM

## 2021-11-22 DIAGNOSIS — I10 Essential (primary) hypertension: Secondary | ICD-10-CM

## 2021-11-22 MED ORDER — AMLODIPINE BESYLATE 5 MG PO TABS: 5.0000 mg | ORAL_TABLET | Freq: Every day | ORAL | 1 refills | Status: DC

## 2021-11-22 MED ORDER — ATORVASTATIN CALCIUM 20 MG PO TABS: 20.0000 mg | ORAL_TABLET | Freq: Every day | ORAL | 1 refills | Status: DC

## 2021-11-22 MED ORDER — INDAPAMIDE 1.25 MG PO TABS: 1.2500 mg | ORAL_TABLET | Freq: Every day | ORAL | 1 refills | Status: DC

## 2021-11-22 MED ORDER — VALACYCLOVIR HCL 500 MG PO TABS
1000.0000 mg | ORAL_TABLET | Freq: Two times a day (BID) | ORAL | 1 refills | Status: AC
Start: 2021-11-22 — End: ?

## 2021-11-22 MED ORDER — POTASSIUM CHLORIDE ER 10 MEQ PO TBCR: EXTENDED_RELEASE_TABLET | ORAL | 1 refills | Status: DC
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
## 2021-12-01 NOTE — Telephone Encounter (Signed)
Patient had physical on 11/22/21

## 2022-02-20 ENCOUNTER — Ambulatory Visit: Payer: 59 | Admitting: Family Medicine

## 2022-02-20 ENCOUNTER — Other Ambulatory Visit: Payer: Self-pay | Admitting: *Deleted

## 2022-02-20 DIAGNOSIS — Z780 Asymptomatic menopausal state: Secondary | ICD-10-CM

## 2022-02-20 DIAGNOSIS — I10 Essential (primary) hypertension: Secondary | ICD-10-CM

## 2022-02-20 DIAGNOSIS — E785 Hyperlipidemia, unspecified: Secondary | ICD-10-CM

## 2022-03-17 LAB — CMP14+EGFR
ALT: 23 IU/L (ref 0–32)
AST: 28 IU/L (ref 0–40)
Albumin/Globulin Ratio: 1.2 (ref 1.2–2.2)
Albumin: 4.2 g/dL (ref 3.8–4.8)
Alkaline Phosphatase: 53 IU/L (ref 44–121)
BUN/Creatinine Ratio: 11 — ABNORMAL LOW (ref 12–28)
BUN: 12 mg/dL (ref 8–27)
Bilirubin Total: 0.3 mg/dL (ref 0.0–1.2)
CO2: 28 mmol/L (ref 20–29)
Calcium: 9.3 mg/dL (ref 8.7–10.3)
Chloride: 100 mmol/L (ref 96–106)
Creatinine, Ser: 1.1 mg/dL — ABNORMAL HIGH (ref 0.57–1.00)
Globulin, Total: 3.4 g/dL (ref 1.5–4.5)
Glucose: 108 mg/dL — ABNORMAL HIGH (ref 70–99)
Potassium: 3.6 mmol/L (ref 3.5–5.2)
Sodium: 142 mmol/L (ref 134–144)
Total Protein: 7.6 g/dL (ref 6.0–8.5)
eGFR: 57 mL/min/{1.73_m2} — ABNORMAL LOW (ref >59)

## 2022-03-17 LAB — LIPID PANEL WITH LDL/HDL RATIO
Cholesterol, Total: 175 mg/dL (ref 100–199)
HDL: 77 mg/dL (ref >39)
LDL Chol Calc (NIH): 87 mg/dL (ref 0–99)
LDL/HDL Ratio: 1.1 ratio (ref 0.0–3.2)
Triglycerides: 53 mg/dL (ref 0–149)
VLDL Cholesterol Cal: 11 mg/dL (ref 5–40)

## 2022-03-17 LAB — TSH+FREE T4
Free T4: 0.92 ng/dL (ref 0.82–1.77)
TSH: 1.8 u[IU]/mL (ref 0.450–4.500)

## 2022-03-19 ENCOUNTER — Telehealth: Payer: Self-pay | Admitting: Family Medicine

## 2022-03-19 ENCOUNTER — Telehealth (INDEPENDENT_AMBULATORY_CARE_PROVIDER_SITE_OTHER): Payer: Managed Care, Other (non HMO) | Admitting: Family Medicine

## 2022-03-19 ENCOUNTER — Encounter: Payer: Self-pay | Admitting: Family Medicine

## 2022-03-19 DIAGNOSIS — I10 Essential (primary) hypertension: Secondary | ICD-10-CM

## 2022-03-19 DIAGNOSIS — R7303 Prediabetes: Secondary | ICD-10-CM

## 2022-03-19 DIAGNOSIS — E785 Hyperlipidemia, unspecified: Secondary | ICD-10-CM | POA: Diagnosis not present

## 2022-03-19 MED ORDER — ALPRAZOLAM 0.5 MG PO TABS: ORAL_TABLET | ORAL | 2 refills | Status: DC

## 2022-03-19 MED ORDER — AMLODIPINE BESYLATE 5 MG PO TABS: 5.0000 mg | ORAL_TABLET | Freq: Every day | ORAL | 1 refills | Status: DC

## 2022-03-19 MED ORDER — ROSUVASTATIN CALCIUM 10 MG PO TABS: 10.0000 mg | ORAL_TABLET | Freq: Every day | ORAL | 3 refills | Status: DC

## 2022-03-19 MED ORDER — POTASSIUM CHLORIDE ER 10 MEQ PO TBCR: EXTENDED_RELEASE_TABLET | ORAL | 1 refills | Status: DC

## 2022-03-19 MED ORDER — INDAPAMIDE 1.25 MG PO TABS: 1.2500 mg | ORAL_TABLET | Freq: Every day | ORAL | 1 refills | Status: DC

## 2022-03-19 NOTE — Progress Notes (Signed)
? ?Subjective:  ? ? Patient ID: [name], female    DOB: [birth year], 61 y.o.   MRN: [id number] ? ?HPI ?Pt following up on recent lab work that was done earlier this month. Pt states in the past few months (around February) she noticed that her fingers on her right hand would become numb and turn white. Pt states she can shake them or run them under hot water and the numbness will go away.  ? ?Patient is taking her cholesterol medicine.  Glucose slightly elevated on blood draw.  No family history of diabetes.  It is true that a atorvastatin in some individuals can increase sugar ?Crestor less likely to do so ?She is trying to watch her diet exercise and keep her weight down ? ?Results for orders placed or performed in visit on 02/20/22  ?TSH + free T4  ?Result Value Ref Range  ? TSH 1.800 0.450 - 4.500 uIU/mL  ? Free T4 0.92 0.82 - 1.77 ng/dL  ?Lipid Panel With LDL/HDL Ratio  ?Result Value Ref Range  ? Cholesterol, Total 175 100 - 199 mg/dL  ? Triglycerides 53 0 - 149 mg/dL  ? HDL 77 >39 mg/dL  ? VLDL Cholesterol Cal 11 5 - 40 mg/dL  ? LDL Chol Calc (NIH) 87 0 - 99 mg/dL  ? LDL/HDL Ratio 1.1 0.0 - 3.2 ratio  ?CMP14+EGFR  ?Result Value Ref Range  ? Glucose 108 (H) 70 - 99 mg/dL  ? BUN 12 8 - 27 mg/dL  ? Creatinine, Ser 1.10 (H) 0.57 - 1.00 mg/dL  ? eGFR 57 (L) >59 mL/min/1.73  ? BUN/Creatinine Ratio 11 (L) 12 - 28  ? Sodium 142 134 - 144 mmol/L  ? Potassium 3.6 3.5 - 5.2 mmol/L  ? Chloride 100 96 - 106 mmol/L  ? CO2 28 20 - 29 mmol/L  ? Calcium 9.3 8.7 - 10.3 mg/dL  ? Total Protein 7.6 6.0 - 8.5 g/dL  ? Albumin 4.2 3.8 - 4.8 g/dL  ? Globulin, Total 3.4 1.5 - 4.5 g/dL  ? Albumin/Globulin Ratio 1.2 1.2 - 2.2  ? Bilirubin Total 0.3 0.0 - 1.2 mg/dL  ? Alkaline Phosphatase 53 44 - 121 IU/L  ? AST 28 0 - 40 IU/L  ? ALT 23 0 - 32 IU/L  ? ?Video visit today ?Virtual Visit via Telephone Note ? ?I connected with [name] on 03/19/22 at 11:00 AM EDT by telephone and verified that I am speaking with the correct  person using two identifiers. ? ?Location: ?Patient: home ?Provider: office ?  ?I discussed the limitations, risks, security and privacy concerns of performing an evaluation and management service by telephone and the availability of in person appointments. I also discussed with the patient that there may be a patient responsible charge related to this service. The patient expressed understanding and agreed to proceed. ? ? ?History of Present Illness: ? ?  ?Observations/Objective: ? ? ?Assessment and Plan: ? ? ?Follow Up Instructions: ? ?  ?I discussed the assessment and treatment plan with the patient. The patient was provided an opportunity to ask questions and all were answered. The patient agreed with the plan and demonstrated an understanding of the instructions. ?  ?The patient was advised to call back or seek an in-person evaluation if the symptoms worsen or if the condition fails to improve as anticipated. ? ?I provided  20 minutes of non-face-to-face time during this encounter. ? ? ?  ? ? ?Review of Systems ? ?   ?  Objective:  ? Physical Exam ?Video visit ?Patient had virtual visit-video ?Appears to be in no distress ?Atraumatic ?Neuro able to relate and oriented ?No apparent resp distress ?Color normal ? ? ? ? ?   ?Assessment & Plan:  ?1. Hyperlipidemia, unspecified hyperlipidemia type ?Switch to Crestor less effect on her glucose. ? ?2. HTN (hypertension), benign ?Check labs before next visit continue medication keep blood pressure under good control check urine ACR before next visit ? ?3. Prediabetes ?Check A1c before next visit watch starches in diet continue exercise keep weight down ? ?Finally patient having some rainout syndrome in her hand if it continues to cause problems or get worse recommend she let us know ? ?She also relates some intermittent numbness in the hand if this gets worse recommend neurology consult ? ?Follow-up within in the fall time ? ? ?

## 2022-03-19 NOTE — Progress Notes (Signed)
[facility]TR[facility]

## 2022-03-19 NOTE — Telephone Encounter (Signed)
[name], [name] are scheduled for a virtual visit with your provider today.   ? ?Just as we do with appointments in the office, we must obtain your consent to participate.  Your consent will be active for this visit and any virtual visit you may have with one of our providers in the next 365 days.   ? ?If you have a MyChart account, I can also send a copy of this consent to you electronically.  All virtual visits are billed to your insurance company just like a traditional visit in the office.  As this is a virtual visit, video technology does not allow for your provider to perform a traditional examination.  This may limit your provider's ability to fully assess your condition.  If your provider identifies any concerns that need to be evaluated in person or the need to arrange testing such as labs, EKG, etc, we will make arrangements to do so.   ? ?Although advances in technology are sophisticated, we cannot ensure that it will always work on either your end or our end.  If the connection with a video visit is poor, we may have to switch to a telephone visit.  With either a video or telephone visit, we are not always able to ensure that we have a secure connection.   I need to obtain your verbal consent now.   Are you willing to proceed with your visit today?  ? ?[name] has provided verbal consent on 03/19/2022 for a virtual visit (video or telephone). ? ? ?Vicente Males, LPN ?9/35/7017  79:39 AM ?  ?

## 2022-03-19 NOTE — Progress Notes (Signed)
Labs ordered in epic

## 2022-04-12 ENCOUNTER — Other Ambulatory Visit: Payer: Self-pay | Admitting: *Deleted

## 2022-04-20 ENCOUNTER — Telehealth: Payer: Self-pay

## 2022-04-20 NOTE — Telephone Encounter (Signed)
[facility] Gerald Stabs) called stating pt is wanting to request rosuvastatin (CRESTOR '20mg'$  sent through [facility] on file. Pt was prescribed rosuvastatin (CRESTOR) 10 MG tablet  on 04/24 for 90 day supply. I do not know if the pt was confused with the dosage or if she want the increase. I spoke with Gerald Stabs from [employer]

## 2022-04-20 NOTE — Telephone Encounter (Signed)
Left message to return call 

## 2022-04-24 MED ORDER — ROSUVASTATIN CALCIUM 10 MG PO TABS: 10.0000 mg | ORAL_TABLET | Freq: Every day | ORAL | 1 refills | Status: DC

## 2022-04-24 NOTE — Telephone Encounter (Signed)
Patient is currently taking the Crestor 10 mg as prescribed and needs script [facility] to [facility]. Prescription sent electronically to pharmacy. Patient aware

## 2022-04-26 DIAGNOSIS — R0683 Snoring: Secondary | ICD-10-CM | POA: Insufficient documentation

## 2022-05-24 ENCOUNTER — Encounter: Payer: Self-pay | Admitting: Family Medicine

## 2022-05-24 ENCOUNTER — Ambulatory Visit: Payer: Commercial Managed Care - HMO | Admitting: Family Medicine

## 2022-05-24 VITALS — BP 135/86 | HR 63 | Temp 96.6°F | Wt 151.6 lb

## 2022-05-24 DIAGNOSIS — M542 Cervicalgia: Secondary | ICD-10-CM

## 2022-05-24 DIAGNOSIS — L918 Other hypertrophic disorders of the skin: Secondary | ICD-10-CM | POA: Insufficient documentation

## 2022-05-24 NOTE — Patient Instructions (Signed)
Heat. Ibuprofen.  If continues to persist, please let us know.  Referral has been placed to Dermatology. Their office will be in contact. Dr. Marguerite Olea

## 2022-05-24 NOTE — Progress Notes (Signed)
Subjective:  Patient ID: [name], female    DOB: [birth year]  Age: 61 y.o. MRN: [id number]  CC: Chief Complaint  Patient presents with   Skin Tags    Pt having burning sensation on left side of neck where skin tags are. Began about 3 weeks ago.     HPI:  61 year old female presents for evaluation of the above.  3 week history of pain on the left side of the neck. Described as a burning sensation. She has been taking Ibuprofen for the pain. She states that she has several skin tags around her neck and believes this is the culprit of her pain. She would like a referral to dermatology.  Patient Active Problem List   Diagnosis Date Noted   Neck pain 05/24/2022   Cutaneous skin tags 05/24/2022   Post-menopausal 11/22/2021   Herpes simplex without complication 99/24/2683   GAD (generalized anxiety disorder) 05/21/2018   Hyperlipidemia 09/27/2014    Social Hx   Social History   Socioeconomic History   Marital status: Divorced    Spouse name: Not on file   Number of children: Not on file   Years of education: Not on file   Highest education level: Not on file  Occupational History   Not on file  Tobacco Use   Smoking status: Never   Smokeless tobacco: Never  Substance and Sexual Activity   Alcohol use: Yes    Alcohol/week: 1.0 standard drink of alcohol    Types: 1 Glasses of wine per week   Drug use: No   Sexual activity: Yes    Birth control/protection: Surgical    Comment: BTL  Other Topics Concern   Not on file  Social History Narrative   Not on file   Social Determinants of Health   Financial Resource Strain: Not on file  Food Insecurity: Not on file  Transportation Needs: Not on file  Physical Activity: Not on file  Stress: Not on file  Social Connections: Not on file    Review of Systems Per HPI  Objective:  BP 135/86   Pulse 63   Temp (!) 96.6 F (35.9 C)   Wt 151 lb 9.6 oz (68.8 kg)   LMP 11/25/2012   SpO2 100%   BMI 24.47 kg/m       05/24/2022   11:50 AM 11/22/2021    9:10 AM 10/06/2021    5:59 PM  BP/Weight  Systolic BP 419 622 297  Diastolic BP 86 85 96  Wt. (Lbs) 151.6 147.4   BMI 24.47 kg/m2 23.79 kg/m2     Physical Exam Constitutional:      General: She is not in acute distress.    Appearance: Normal appearance. She is not ill-appearing.  HENT:     Head: Normocephalic and atraumatic.  Neck:      Comments: Tension and tenderness at the labelled location. There are several small raised dark skin lesions and skin tags noted around the neck. Pulmonary:     Effort: Pulmonary effort is normal. No respiratory distress.  Neurological:     Mental Status: She is alert.  Psychiatric:        Mood and Affect: Mood normal.        Behavior: Behavior normal.    Lab Results  Component Value Date   WBC 3.0 (L) 11/15/2021   HGB 13.5 11/15/2021   HCT 41.2 11/15/2021   PLT 236 11/15/2021   GLUCOSE 108 (H) 03/16/2022   CHOL 175  03/16/2022   TRIG 53 03/16/2022   HDL 77 03/16/2022   LDLCALC 87 03/16/2022   ALT 23 03/16/2022   AST 28 03/16/2022   NA 142 03/16/2022   K 3.6 03/16/2022   CL 100 03/16/2022   CREATININE 1.10 (H) 03/16/2022   BUN 12 03/16/2022   CO2 28 03/16/2022   TSH 1.800 03/16/2022   INR 1.2 07/05/2008   HGBA1C 6.0 (H) 11/15/2021     Assessment & Plan:   Problem List Items Addressed This Visit       Musculoskeletal and Integument   Cutaneous skin tags    Patient would like them removed.  Sending to Dermatology.      Relevant Orders   Ambulatory referral to Dermatology     Other   Neck pain - Primary    Patient's neck pain appears to be muscular in origin. Advised use of heat and an anti-inflammatory. I do not feel that skin tags are the culprit of her discomfort.       [place]

## 2022-05-24 NOTE — Assessment & Plan Note (Signed)
Patient would like them removed.  Sending to Dermatology.

## 2022-05-24 NOTE — Assessment & Plan Note (Signed)
Patient's neck pain appears to be muscular in origin. Advised use of heat and an anti-inflammatory. I do not feel that skin tags are the culprit of her discomfort.

## 2022-08-13 ENCOUNTER — Other Ambulatory Visit: Payer: Self-pay | Admitting: *Deleted

## 2022-08-13 MED ORDER — ROSUVASTATIN CALCIUM 10 MG PO TABS: 10.0000 mg | ORAL_TABLET | Freq: Every day | ORAL | 0 refills | Status: DC

## 2022-09-20 ENCOUNTER — Ambulatory Visit: Payer: Commercial Managed Care - HMO | Admitting: Nurse Practitioner

## 2022-09-20 ENCOUNTER — Encounter: Payer: Self-pay | Admitting: Nurse Practitioner

## 2022-09-20 VITALS — BP 125/82 | HR 88 | Ht 66.0 in | Wt 153.4 lb

## 2022-09-20 DIAGNOSIS — H0015 Chalazion left lower eyelid: Secondary | ICD-10-CM

## 2022-09-20 NOTE — Patient Instructions (Addendum)
[email or web address] [address], [place], [place] [address]  [phone]  Feeling Good: The New Mood Therapy By Dr. Shanon Brow Burn  [facility] you connect your past, present, and future, by providing you pathways for progress. ([email or web address])  [email or web address]

## 2022-09-20 NOTE — Progress Notes (Signed)
   Subjective:    Patient ID: [name], female    DOB: 09/22/1961, 61 y.o.   MRN: [id number]  HPI  Patient arrives with right eye swelling off and on since June- Started with a blackhead on the lid in June- took a long time to go away- has happened in both eyes off and on.  Patient presents today with swelling and pain to her left lower eyelid times a few days.  Patient denies any changes to her vision or discharge coming from her eye.  Review of Systems  Eyes:  Positive for pain.  All other systems reviewed and are negative.      Objective:   Physical Exam Vitals reviewed.  Constitutional:      General: She is not in acute distress.    Appearance: Normal appearance. She is normal weight. She is not ill-appearing, toxic-appearing or diaphoretic.  HENT:     Head: Normocephalic and atraumatic.  Eyes:     General:        Right eye: No discharge.        Left eye: No discharge.     Extraocular Movements: Extraocular movements intact.     Conjunctiva/sclera: Conjunctivae normal.     Pupils: Pupils are equal, round, and reactive to light.     Comments: Small white cyst noted to left lower eyelid.  Left lower eyelid mildly swollen.  No redness or discharge noted.  Cardiovascular:     Rate and Rhythm: Normal rate and regular rhythm.     Pulses: Normal pulses.     Heart sounds: Normal heart sounds. No murmur heard. Pulmonary:     Effort: Pulmonary effort is normal. No respiratory distress.     Breath sounds: Normal breath sounds. No wheezing.  Musculoskeletal:     Comments: Grossly intact  Skin:    General: Skin is warm.     Capillary Refill: Capillary refill takes less than 2 seconds.  Neurological:     Mental Status: She is alert.     Comments: Grossly intact  Psychiatric:        Mood and Affect: Mood normal.        Behavior: Behavior normal.           Assessment & Plan:   1. Chalazion of left lower eyelid - Suspect Meibomian gland cyst/blockage -  Ambulatory referral to Ophthalmology, urgent - Continue to use warm compresses -Follow up if area not better or worsen    Note:  This document was prepared using Dragon voice recognition software and may include unintentional dictation errors. Note - This record has been created using [employer].  Chart creation errors have been sought, but may not always  have been located. Such creation errors do not reflect on  the standard of medical care.

## 2022-09-21 ENCOUNTER — Other Ambulatory Visit: Payer: Self-pay

## 2022-09-21 ENCOUNTER — Telehealth: Payer: Self-pay | Admitting: Family Medicine

## 2022-09-21 MED ORDER — CEPHALEXIN 500 MG PO CAPS: 500.0000 mg | ORAL_CAPSULE | Freq: Three times a day (TID) | ORAL | 0 refills | Status: AC

## 2022-09-21 NOTE — Telephone Encounter (Signed)
Patient has been made aware per drs recommendations. She verbalizes understanding.

## 2022-09-21 NOTE — Telephone Encounter (Signed)
Try warm compresses several times per day Keflex 500 mg 1 taken 3 times daily for the next 7 days See eye specialist as planned follow-up with [place] sooner if problems

## 2022-09-21 NOTE — Telephone Encounter (Signed)
Pt seen [name] yesterday for swelling of eyelid. Pt states swelling and pain is worse today. Also weeping. Pt has eye dr appt on 09/25/22. Pt wanting to know if anything can be sent in for pain until she see eye dr. Please advise. Thank you  Yesterdays note faxed to [facility] along with referral upon pt request.

## 2022-09-22 ENCOUNTER — Encounter: Payer: Self-pay | Admitting: Nurse Practitioner

## 2022-10-08 ENCOUNTER — Telehealth: Payer: Self-pay

## 2022-10-08 DIAGNOSIS — E785 Hyperlipidemia, unspecified: Secondary | ICD-10-CM

## 2022-10-08 DIAGNOSIS — Z79899 Other long term (current) drug therapy: Secondary | ICD-10-CM

## 2022-10-08 DIAGNOSIS — I10 Essential (primary) hypertension: Secondary | ICD-10-CM

## 2022-10-08 DIAGNOSIS — R7301 Impaired fasting glucose: Secondary | ICD-10-CM

## 2022-10-08 DIAGNOSIS — Z0000 Encounter for general adult medical examination without abnormal findings: Secondary | ICD-10-CM

## 2022-10-08 NOTE — Telephone Encounter (Signed)
Caller name: [name]  On DPR?: Yes  Call back number: [phone] (mobile)  Provider they see: Kathyrn Drown, MD  Reason for call:Pt has Phy schedule Jan 3 and needs blood work ordered before then

## 2022-10-08 NOTE — Telephone Encounter (Signed)
Pt has active labs from 03/19/22-urine micro, bmet, hepatic, lipid, A1C. Please advise. Thank you

## 2022-10-09 NOTE — Telephone Encounter (Signed)
All of the lab work that was ordered back in April that has not been drawn yet-please reorder Diagnosis with wellness, fasting hyperglycemia, elevated creatinine

## 2022-10-09 NOTE — Telephone Encounter (Signed)
Lab orders placed. Left message to return call  

## 2022-10-09 NOTE — Telephone Encounter (Signed)
Patient has been informed per lab orders and recommendations.

## 2022-10-25 ENCOUNTER — Encounter: Payer: Self-pay | Admitting: Internal Medicine

## 2022-11-17 ENCOUNTER — Other Ambulatory Visit: Payer: Self-pay | Admitting: Family Medicine

## 2022-11-28 ENCOUNTER — Encounter: Payer: Commercial Managed Care - HMO | Admitting: Family Medicine

## 2022-12-05 DIAGNOSIS — R69 Illness, unspecified: Secondary | ICD-10-CM | POA: Diagnosis not present

## 2022-12-12 DIAGNOSIS — I10 Essential (primary) hypertension: Secondary | ICD-10-CM | POA: Diagnosis not present

## 2022-12-12 DIAGNOSIS — Z0000 Encounter for general adult medical examination without abnormal findings: Secondary | ICD-10-CM | POA: Diagnosis not present

## 2022-12-12 DIAGNOSIS — E785 Hyperlipidemia, unspecified: Secondary | ICD-10-CM | POA: Diagnosis not present

## 2022-12-12 DIAGNOSIS — R7301 Impaired fasting glucose: Secondary | ICD-10-CM | POA: Diagnosis not present

## 2022-12-13 ENCOUNTER — Ambulatory Visit (INDEPENDENT_AMBULATORY_CARE_PROVIDER_SITE_OTHER): Payer: 59 | Admitting: Family Medicine

## 2022-12-13 VITALS — BP 116/80 | HR 75 | Temp 98.2°F | Ht 65.75 in | Wt 151.4 lb

## 2022-12-13 DIAGNOSIS — I10 Essential (primary) hypertension: Secondary | ICD-10-CM

## 2022-12-13 DIAGNOSIS — Z13820 Encounter for screening for osteoporosis: Secondary | ICD-10-CM

## 2022-12-13 DIAGNOSIS — R7303 Prediabetes: Secondary | ICD-10-CM | POA: Diagnosis not present

## 2022-12-13 DIAGNOSIS — Z1211 Encounter for screening for malignant neoplasm of colon: Secondary | ICD-10-CM | POA: Diagnosis not present

## 2022-12-13 DIAGNOSIS — Z0000 Encounter for general adult medical examination without abnormal findings: Secondary | ICD-10-CM

## 2022-12-13 DIAGNOSIS — H0015 Chalazion left lower eyelid: Secondary | ICD-10-CM | POA: Diagnosis not present

## 2022-12-13 DIAGNOSIS — E785 Hyperlipidemia, unspecified: Secondary | ICD-10-CM | POA: Diagnosis not present

## 2022-12-13 DIAGNOSIS — Z0001 Encounter for general adult medical examination with abnormal findings: Secondary | ICD-10-CM | POA: Diagnosis not present

## 2022-12-13 DIAGNOSIS — Z129 Encounter for screening for malignant neoplasm, site unspecified: Secondary | ICD-10-CM

## 2022-12-13 LAB — HEPATIC FUNCTION PANEL
ALT: 36 IU/L — ABNORMAL HIGH (ref 0–32)
AST: 40 IU/L (ref 0–40)
Albumin: 4.4 g/dL (ref 3.9–4.9)
Alkaline Phosphatase: 56 IU/L (ref 44–121)
Bilirubin Total: 0.2 mg/dL (ref 0.0–1.2)
Bilirubin, Direct: <0.1 mg/dL (ref 0.00–0.40)
Total Protein: 8.1 g/dL (ref 6.0–8.5)

## 2022-12-13 LAB — LIPID PANEL
Chol/HDL Ratio: 2.6 ratio (ref 0.0–4.4)
Cholesterol, Total: 201 mg/dL — ABNORMAL HIGH (ref 100–199)
HDL: 78 mg/dL (ref >39)
LDL Chol Calc (NIH): 109 mg/dL — ABNORMAL HIGH (ref 0–99)
Triglycerides: 79 mg/dL (ref 0–149)
VLDL Cholesterol Cal: 14 mg/dL (ref 5–40)

## 2022-12-13 LAB — BASIC METABOLIC PANEL
BUN/Creatinine Ratio: 10 — ABNORMAL LOW (ref 12–28)
BUN: 10 mg/dL (ref 8–27)
CO2: 27 mmol/L (ref 20–29)
Calcium: 10.2 mg/dL (ref 8.7–10.3)
Chloride: 97 mmol/L (ref 96–106)
Creatinine, Ser: 1.02 mg/dL — ABNORMAL HIGH (ref 0.57–1.00)
Glucose: 105 mg/dL — ABNORMAL HIGH (ref 70–99)
Potassium: 3.4 mmol/L — ABNORMAL LOW (ref 3.5–5.2)
Sodium: 141 mmol/L (ref 134–144)
eGFR: 62 mL/min/{1.73_m2} (ref >59)

## 2022-12-13 LAB — MICROALBUMIN / CREATININE URINE RATIO
Creatinine, Urine: 33.3 mg/dL
Microalb/Creat Ratio: <9 mg/g creat (ref 0–29)
Microalbumin, Urine: <3 ug/mL

## 2022-12-13 LAB — HEMOGLOBIN A1C
Est. average glucose Bld gHb Est-mCnc: 128 mg/dL
Hgb A1c MFr Bld: 6.1 % — ABNORMAL HIGH (ref 4.8–5.6)

## 2022-12-13 MED ORDER — INDAPAMIDE 1.25 MG PO TABS: 1.2500 mg | ORAL_TABLET | Freq: Every day | ORAL | 5 refills | Status: DC

## 2022-12-13 MED ORDER — AMLODIPINE BESYLATE 5 MG PO TABS: 5.0000 mg | ORAL_TABLET | Freq: Every day | ORAL | 1 refills | Status: DC

## 2022-12-13 MED ORDER — INDAPAMIDE 1.25 MG PO TABS: 1.2500 mg | ORAL_TABLET | Freq: Every day | ORAL | 1 refills | Status: DC

## 2022-12-13 MED ORDER — ROSUVASTATIN CALCIUM 10 MG PO TABS: 10.0000 mg | ORAL_TABLET | Freq: Every day | ORAL | 3 refills | Status: DC

## 2022-12-13 MED ORDER — POTASSIUM CHLORIDE ER 10 MEQ PO TBCR: EXTENDED_RELEASE_TABLET | ORAL | 1 refills | Status: DC

## 2022-12-13 MED ORDER — POTASSIUM CHLORIDE ER 10 MEQ PO TBCR: EXTENDED_RELEASE_TABLET | ORAL | 5 refills | Status: DC

## 2022-12-13 NOTE — Progress Notes (Signed)
Subjective:    Patient ID: [name], female    DOB: 1961-03-22, 62 y.o.   MRN: [id number]  HPI The patient comes in today for a wellness visit. Wellness Stress levels are moderate Overall she is doing well Exercises by walking her dog Denies chest tightness pressure pain shortness of breath Bowel movements are good Does have some lower right back pain and right trochanter pain intermittently with activity.  No sciatica symptoms. Patient does not smoke  She does use alcohol 3 times a week Her weight has gone up she is concerned about this   A review of their health history was completed.  A review of medications was also completed.  Any needed refills; yes  Eating habits: good  Falls/  MVA accidents in past few months: no  Regular exercise: yes walking outdoors  Specialist pt sees on regular basis: gynecologist   Preventative health issues were discussed.   Additional concerns: Patient states she is concerned about what she did.  Patient is concerned about hip pain.     Review of Systems     Objective:   Physical Exam General-in no acute distress Eyes-no discharge Lungs-respiratory rate normal, CTA CV-no murmurs,RRR Extremities skin warm dry no edema Neuro grossly normal Behavior normal, alert   Results for orders placed or performed in visit on 10/08/22  Microalbumin/Creatinine Ratio, Urine  Result Value Ref Range   Creatinine, Urine 33.3 Not Estab. mg/dL   Microalbumin, Urine <3.0 Not Estab. ug/mL   Microalb/Creat Ratio <9 0 - 29 mg/g creat  Basic metabolic panel  Result Value Ref Range   Glucose 105 (H) 70 - 99 mg/dL   BUN 10 8 - 27 mg/dL   Creatinine, Ser 1.02 (H) 0.57 - 1.00 mg/dL   eGFR 62 >59 mL/min/1.73   BUN/Creatinine Ratio 10 (L) 12 - 28   Sodium 141 134 - 144 mmol/L   Potassium 3.4 (L) 3.5 - 5.2 mmol/L   Chloride 97 96 - 106 mmol/L   CO2 27 20 - 29 mmol/L   Calcium 10.2 8.7 - 10.3 mg/dL  Hepatic function panel  Result Value  Ref Range   Total Protein 8.1 6.0 - 8.5 g/dL   Albumin 4.4 3.9 - 4.9 g/dL   Bilirubin Total 0.2 0.0 - 1.2 mg/dL   Bilirubin, Direct <0.10 0.00 - 0.40 mg/dL   Alkaline Phosphatase 56 44 - 121 IU/L   AST 40 0 - 40 IU/L   ALT 36 (H) 0 - 32 IU/L  Lipid panel  Result Value Ref Range   Cholesterol, Total 201 (H) 100 - 199 mg/dL   Triglycerides 79 0 - 149 mg/dL   HDL 78 >39 mg/dL   VLDL Cholesterol Cal 14 5 - 40 mg/dL   LDL Chol Calc (NIH) 109 (H) 0 - 99 mg/dL   Chol/HDL Ratio 2.6 0.0 - 4.4 ratio  Hemoglobin A1c  Result Value Ref Range   Hgb A1c MFr Bld 6.1 (H) 4.8 - 5.6 %   Est. average glucose Bld gHb Est-mCnc 128 mg/dL        Assessment & Plan:  .1. Encounter for screening colonoscopy Referral to [facility] for colonoscopy - Ambulatory referral to Gastroenterology  2. Routine general medical examination at a health care facility Adult wellness-complete.wellness physical was conducted today. Importance of diet and exercise were discussed in detail.  Importance of stress reduction and healthy living were discussed.  In addition to this a discussion regarding safety was also covered.  We also reviewed  over immunizations and gave recommendations regarding current immunization needed for age.   In addition to this additional areas were also touched on including: Preventative health exams needed:  Colonoscopy referral was made  Patient was advised yearly wellness exam   3. HTN (hypertension), benign HTN- patient seen for follow-up regarding HTN.   Diet, medication compliance, appropriate labs and refills were completed.   Importance of keeping blood pressure under good control to lessen the risk of complications discussed Regular follow-up visits discussed Lab work looks good urine micro looks good  4. Chalazion of left lower eyelid Reoccurring nature we did discuss pathology behind this if she has ongoing troubles or worsening issues we can refer her to different eye  care  5. Hyperlipidemia, unspecified hyperlipidemia type Continue medication healthy diet regular activity  6. Prediabetes A1c slightly elevated portion control regular physical activity try to bring weight down  Pap smear through her gynecologist

## 2022-12-14 ENCOUNTER — Encounter: Payer: Self-pay | Admitting: Family Medicine

## 2022-12-14 NOTE — Addendum Note (Signed)
Addended by: Dairl Ponder on: 12/14/2022 10:35 AM   Modules accepted: Orders

## 2022-12-21 ENCOUNTER — Encounter: Payer: Self-pay | Admitting: Internal Medicine

## 2022-12-24 ENCOUNTER — Telehealth: Payer: Self-pay | Admitting: *Deleted

## 2022-12-24 NOTE — Telephone Encounter (Signed)
Attempted to call x 2 message left with call back # to return call to reschedule pre-visit or upcoming procedure on 01/10/23 will be canceled.

## 2022-12-24 NOTE — Telephone Encounter (Signed)
No return call received,no show letter sent via my chart and mailed pre-visit and procedure canceled.

## 2023-01-04 ENCOUNTER — Encounter: Payer: Self-pay | Admitting: Internal Medicine

## 2023-01-09 DIAGNOSIS — S82002A Unspecified fracture of left patella, initial encounter for closed fracture: Secondary | ICD-10-CM | POA: Diagnosis not present

## 2023-01-10 ENCOUNTER — Encounter: Payer: 59 | Admitting: Internal Medicine

## 2023-01-15 DIAGNOSIS — G8918 Other acute postprocedural pain: Secondary | ICD-10-CM | POA: Diagnosis not present

## 2023-01-15 DIAGNOSIS — S82042A Displaced comminuted fracture of left patella, initial encounter for closed fracture: Secondary | ICD-10-CM | POA: Diagnosis not present

## 2023-01-15 DIAGNOSIS — X58XXXA Exposure to other specified factors, initial encounter: Secondary | ICD-10-CM | POA: Diagnosis not present

## 2023-01-15 DIAGNOSIS — W19XXXA Unspecified fall, initial encounter: Secondary | ICD-10-CM | POA: Diagnosis not present

## 2023-01-16 DIAGNOSIS — B9689 Other specified bacterial agents as the cause of diseases classified elsewhere: Secondary | ICD-10-CM | POA: Insufficient documentation

## 2023-01-16 DIAGNOSIS — F439 Reaction to severe stress, unspecified: Secondary | ICD-10-CM | POA: Insufficient documentation

## 2023-01-16 DIAGNOSIS — Z6791 Unspecified blood type, Rh negative: Secondary | ICD-10-CM | POA: Insufficient documentation

## 2023-01-16 DIAGNOSIS — F32A Depression, unspecified: Secondary | ICD-10-CM | POA: Insufficient documentation

## 2023-01-16 DIAGNOSIS — D649 Anemia, unspecified: Secondary | ICD-10-CM | POA: Insufficient documentation

## 2023-01-16 DIAGNOSIS — N939 Abnormal uterine and vaginal bleeding, unspecified: Secondary | ICD-10-CM | POA: Insufficient documentation

## 2023-01-16 DIAGNOSIS — B009 Herpesviral infection, unspecified: Secondary | ICD-10-CM | POA: Insufficient documentation

## 2023-01-16 DIAGNOSIS — N760 Acute vaginitis: Secondary | ICD-10-CM | POA: Insufficient documentation

## 2023-01-16 DIAGNOSIS — D259 Leiomyoma of uterus, unspecified: Secondary | ICD-10-CM | POA: Insufficient documentation

## 2023-01-16 DIAGNOSIS — N644 Mastodynia: Secondary | ICD-10-CM | POA: Insufficient documentation

## 2023-02-11 DIAGNOSIS — S82002D Unspecified fracture of left patella, subsequent encounter for closed fracture with routine healing: Secondary | ICD-10-CM | POA: Diagnosis not present

## 2023-02-14 ENCOUNTER — Encounter: Payer: 59 | Admitting: Internal Medicine

## 2023-02-26 DIAGNOSIS — M25562 Pain in left knee: Secondary | ICD-10-CM | POA: Diagnosis not present

## 2023-03-04 DIAGNOSIS — M25562 Pain in left knee: Secondary | ICD-10-CM | POA: Diagnosis not present

## 2023-03-08 DIAGNOSIS — M25562 Pain in left knee: Secondary | ICD-10-CM | POA: Diagnosis not present

## 2023-03-11 DIAGNOSIS — M25562 Pain in left knee: Secondary | ICD-10-CM | POA: Diagnosis not present

## 2023-03-21 DIAGNOSIS — M25562 Pain in left knee: Secondary | ICD-10-CM | POA: Diagnosis not present

## 2023-03-25 DIAGNOSIS — M25562 Pain in left knee: Secondary | ICD-10-CM | POA: Diagnosis not present

## 2023-04-05 DIAGNOSIS — M25562 Pain in left knee: Secondary | ICD-10-CM | POA: Diagnosis not present

## 2023-04-08 DIAGNOSIS — M25562 Pain in left knee: Secondary | ICD-10-CM | POA: Diagnosis not present

## 2023-04-12 DIAGNOSIS — M25562 Pain in left knee: Secondary | ICD-10-CM | POA: Diagnosis not present

## 2023-04-16 DIAGNOSIS — M25562 Pain in left knee: Secondary | ICD-10-CM | POA: Diagnosis not present

## 2023-04-19 DIAGNOSIS — M25562 Pain in left knee: Secondary | ICD-10-CM | POA: Diagnosis not present

## 2023-04-23 DIAGNOSIS — M25562 Pain in left knee: Secondary | ICD-10-CM | POA: Diagnosis not present

## 2023-04-23 DIAGNOSIS — Z4789 Encounter for other orthopedic aftercare: Secondary | ICD-10-CM | POA: Diagnosis not present

## 2023-04-26 DIAGNOSIS — M25562 Pain in left knee: Secondary | ICD-10-CM | POA: Diagnosis not present

## 2023-04-29 ENCOUNTER — Other Ambulatory Visit: Payer: Self-pay | Admitting: Family Medicine

## 2023-04-29 ENCOUNTER — Telehealth: Payer: Self-pay | Admitting: Family Medicine

## 2023-04-29 MED ORDER — ALPRAZOLAM 0.5 MG PO TABS: ORAL_TABLET | ORAL | 0 refills | Status: AC

## 2023-04-29 NOTE — Telephone Encounter (Signed)
Reill on ALPRAZolam (XANAX) 0.5 MG tablet  send to [id number] [place] [address]. She states never got last prescription filled and now needs new one sent in.

## 2023-04-29 NOTE — Telephone Encounter (Signed)
Refill of Xanax was sent in use sparingly caution drowsiness not with oxycodone please keep follow-up visit in July

## 2023-04-30 NOTE — Telephone Encounter (Signed)
Patient notified of provider's recommendations and verbalized understanding.  

## 2023-05-03 DIAGNOSIS — M24662 Ankylosis, left knee: Secondary | ICD-10-CM | POA: Diagnosis not present

## 2023-05-06 DIAGNOSIS — M25562 Pain in left knee: Secondary | ICD-10-CM | POA: Diagnosis not present

## 2023-05-13 DIAGNOSIS — T8482XA Fibrosis due to internal orthopedic prosthetic devices, implants and grafts, initial encounter: Secondary | ICD-10-CM | POA: Diagnosis not present

## 2023-05-13 DIAGNOSIS — Z9889 Other specified postprocedural states: Secondary | ICD-10-CM | POA: Diagnosis not present

## 2023-05-13 DIAGNOSIS — G8918 Other acute postprocedural pain: Secondary | ICD-10-CM | POA: Diagnosis not present

## 2023-05-13 DIAGNOSIS — M24662 Ankylosis, left knee: Secondary | ICD-10-CM | POA: Diagnosis not present

## 2023-05-15 DIAGNOSIS — M25562 Pain in left knee: Secondary | ICD-10-CM | POA: Diagnosis not present

## 2023-05-15 DIAGNOSIS — M25662 Stiffness of left knee, not elsewhere classified: Secondary | ICD-10-CM | POA: Diagnosis not present

## 2023-05-16 DIAGNOSIS — M25662 Stiffness of left knee, not elsewhere classified: Secondary | ICD-10-CM | POA: Diagnosis not present

## 2023-05-17 DIAGNOSIS — M25662 Stiffness of left knee, not elsewhere classified: Secondary | ICD-10-CM | POA: Diagnosis not present

## 2023-05-20 DIAGNOSIS — M25662 Stiffness of left knee, not elsewhere classified: Secondary | ICD-10-CM | POA: Diagnosis not present

## 2023-05-22 DIAGNOSIS — M25562 Pain in left knee: Secondary | ICD-10-CM | POA: Diagnosis not present

## 2023-05-23 ENCOUNTER — Ambulatory Visit
Admission: RE | Admit: 2023-05-23 | Discharge: 2023-05-23 | Disposition: A | Discharge status: Home / Self Care | Payer: 59 | Source: Ambulatory Visit | Attending: Family Medicine | Admitting: Family Medicine

## 2023-05-23 DIAGNOSIS — E349 Endocrine disorder, unspecified: Secondary | ICD-10-CM | POA: Diagnosis not present

## 2023-05-23 DIAGNOSIS — N958 Other specified menopausal and perimenopausal disorders: Secondary | ICD-10-CM | POA: Diagnosis not present

## 2023-05-23 DIAGNOSIS — Z1231 Encounter for screening mammogram for malignant neoplasm of breast: Secondary | ICD-10-CM | POA: Diagnosis not present

## 2023-05-24 DIAGNOSIS — M25562 Pain in left knee: Secondary | ICD-10-CM | POA: Diagnosis not present

## 2023-05-27 DIAGNOSIS — M25662 Stiffness of left knee, not elsewhere classified: Secondary | ICD-10-CM | POA: Diagnosis not present

## 2023-05-28 ENCOUNTER — Encounter: Payer: Self-pay | Admitting: Family Medicine

## 2023-05-29 DIAGNOSIS — M25562 Pain in left knee: Secondary | ICD-10-CM | POA: Diagnosis not present

## 2023-05-29 DIAGNOSIS — M25662 Stiffness of left knee, not elsewhere classified: Secondary | ICD-10-CM | POA: Diagnosis not present

## 2023-05-31 DIAGNOSIS — M25562 Pain in left knee: Secondary | ICD-10-CM | POA: Diagnosis not present

## 2023-05-31 DIAGNOSIS — M25662 Stiffness of left knee, not elsewhere classified: Secondary | ICD-10-CM | POA: Diagnosis not present

## 2023-06-05 DIAGNOSIS — M25562 Pain in left knee: Secondary | ICD-10-CM | POA: Diagnosis not present

## 2023-06-10 ENCOUNTER — Encounter: Payer: Self-pay | Admitting: Family Medicine

## 2023-06-10 DIAGNOSIS — M25562 Pain in left knee: Secondary | ICD-10-CM | POA: Diagnosis not present

## 2023-06-12 DIAGNOSIS — M25662 Stiffness of left knee, not elsewhere classified: Secondary | ICD-10-CM | POA: Diagnosis not present

## 2023-06-13 ENCOUNTER — Ambulatory Visit: Payer: 59 | Admitting: Family Medicine

## 2023-06-14 DIAGNOSIS — E785 Hyperlipidemia, unspecified: Secondary | ICD-10-CM | POA: Diagnosis not present

## 2023-06-14 DIAGNOSIS — M25662 Stiffness of left knee, not elsewhere classified: Secondary | ICD-10-CM | POA: Diagnosis not present

## 2023-06-14 DIAGNOSIS — R7301 Impaired fasting glucose: Secondary | ICD-10-CM | POA: Diagnosis not present

## 2023-06-14 DIAGNOSIS — Z79899 Other long term (current) drug therapy: Secondary | ICD-10-CM | POA: Diagnosis not present

## 2023-06-14 DIAGNOSIS — M25562 Pain in left knee: Secondary | ICD-10-CM | POA: Diagnosis not present

## 2023-06-15 LAB — BASIC METABOLIC PANEL
BUN/Creatinine Ratio: 14 (ref 12–28)
BUN: 12 mg/dL (ref 8–27)
CO2: 29 mmol/L (ref 20–29)
Calcium: 10 mg/dL (ref 8.7–10.3)
Chloride: 100 mmol/L (ref 96–106)
Creatinine, Ser: 0.85 mg/dL (ref 0.57–1.00)
Glucose: 108 mg/dL — ABNORMAL HIGH (ref 70–99)
Potassium: 3.5 mmol/L (ref 3.5–5.2)
Sodium: 144 mmol/L (ref 134–144)
eGFR: 77 mL/min/{1.73_m2} (ref >59)

## 2023-06-15 LAB — LIPID PANEL
Chol/HDL Ratio: 2.6 ratio (ref 0.0–4.4)
Cholesterol, Total: 217 mg/dL — ABNORMAL HIGH (ref 100–199)
HDL: 84 mg/dL (ref >39)
LDL Chol Calc (NIH): 118 mg/dL — ABNORMAL HIGH (ref 0–99)
Triglycerides: 86 mg/dL (ref 0–149)
VLDL Cholesterol Cal: 15 mg/dL (ref 5–40)

## 2023-06-15 LAB — HEPATIC FUNCTION PANEL
ALT: 33 IU/L — ABNORMAL HIGH (ref 0–32)
AST: 32 IU/L (ref 0–40)
Albumin: 4.4 g/dL (ref 3.9–4.9)
Alkaline Phosphatase: 67 IU/L (ref 44–121)
Bilirubin Total: 0.3 mg/dL (ref 0.0–1.2)
Bilirubin, Direct: 0.11 mg/dL (ref 0.00–0.40)
Total Protein: 7.7 g/dL (ref 6.0–8.5)

## 2023-06-15 LAB — HEMOGLOBIN A1C
Est. average glucose Bld gHb Est-mCnc: 131 mg/dL
Hgb A1c MFr Bld: 6.2 % — ABNORMAL HIGH (ref 4.8–5.6)

## 2023-06-17 ENCOUNTER — Ambulatory Visit (INDEPENDENT_AMBULATORY_CARE_PROVIDER_SITE_OTHER): Payer: 59 | Admitting: Family Medicine

## 2023-06-17 VITALS — BP 129/82 | HR 79 | Wt 153.6 lb

## 2023-06-17 DIAGNOSIS — R7303 Prediabetes: Secondary | ICD-10-CM | POA: Diagnosis not present

## 2023-06-17 DIAGNOSIS — I10 Essential (primary) hypertension: Secondary | ICD-10-CM

## 2023-06-17 DIAGNOSIS — G8929 Other chronic pain: Secondary | ICD-10-CM

## 2023-06-17 DIAGNOSIS — M25562 Pain in left knee: Secondary | ICD-10-CM

## 2023-06-17 DIAGNOSIS — E785 Hyperlipidemia, unspecified: Secondary | ICD-10-CM

## 2023-06-17 MED ORDER — AMLODIPINE BESYLATE 5 MG PO TABS: 5.0000 mg | ORAL_TABLET | Freq: Every day | ORAL | 1 refills | Status: DC

## 2023-06-17 MED ORDER — INDAPAMIDE 1.25 MG PO TABS: 1.2500 mg | ORAL_TABLET | Freq: Every day | ORAL | 1 refills | Status: AC

## 2023-06-17 MED ORDER — ROSUVASTATIN CALCIUM 10 MG PO TABS: 10.0000 mg | ORAL_TABLET | Freq: Every day | ORAL | 3 refills | Status: AC

## 2023-06-17 MED ORDER — POTASSIUM CHLORIDE ER 10 MEQ PO TBCR: EXTENDED_RELEASE_TABLET | ORAL | 1 refills | Status: AC

## 2023-06-17 NOTE — Progress Notes (Signed)
Subjective:    Patient ID: [name], female    DOB: [birth year], 62 y.o.   MRN: [id number]  HPI Patient arrives today for 6 month follow up. Patient states she would like to discuss her knee.   Patient suffered a patella fracture Unfortunately resulted in the loss of motion after surgery She relates a lot of pain discomfort unable to do the type of things she used to be able to do  Results for orders placed or performed in visit on 10/08/22  Microalbumin/Creatinine Ratio, Urine  Result Value Ref Range   Creatinine, Urine 33.3 Not Estab. mg/dL   Microalbumin, Urine <4.0 Not Estab. ug/mL   Microalb/Creat Ratio <9 0 - 29 mg/g creat  Basic metabolic panel  Result Value Ref Range   Glucose 105 (H) 70 - 99 mg/dL   BUN 10 8 - 27 mg/dL   Creatinine, Ser 9.81 (H) 0.57 - 1.00 mg/dL   eGFR 62 >19 JY/NWG/9.56   BUN/Creatinine Ratio 10 (L) 12 - 28   Sodium 141 134 - 144 mmol/L   Potassium 3.4 (L) 3.5 - 5.2 mmol/L   Chloride 97 96 - 106 mmol/L   CO2 27 20 - 29 mmol/L   Calcium 10.2 8.7 - 10.3 mg/dL  Hepatic function panel  Result Value Ref Range   Total Protein 8.1 6.0 - 8.5 g/dL   Albumin 4.4 3.9 - 4.9 g/dL   Bilirubin Total 0.2 0.0 - 1.2 mg/dL   Bilirubin, Direct <2.13 0.00 - 0.40 mg/dL   Alkaline Phosphatase 56 44 - 121 IU/L   AST 40 0 - 40 IU/L   ALT 36 (H) 0 - 32 IU/L  Lipid panel  Result Value Ref Range   Cholesterol, Total 201 (H) 100 - 199 mg/dL   Triglycerides 79 0 - 149 mg/dL   HDL 78 >08 mg/dL   VLDL Cholesterol Cal 14 5 - 40 mg/dL   LDL Chol Calc (NIH) 657 (H) 0 - 99 mg/dL   Chol/HDL Ratio 2.6 0.0 - 4.4 ratio  Hemoglobin A1c  Result Value Ref Range   Hgb A1c MFr Bld 6.1 (H) 4.8 - 5.6 %   Est. average glucose Bld gHb Est-mCnc 128 mg/dL  Lipid panel  Result Value Ref Range   Cholesterol, Total 217 (H) 100 - 199 mg/dL   Triglycerides 86 0 - 149 mg/dL   HDL 84 >84 mg/dL   VLDL Cholesterol Cal 15 5 - 40 mg/dL   LDL Chol Calc (NIH) 696 (H) 0 - 99 mg/dL    Chol/HDL Ratio 2.6 0.0 - 4.4 ratio  Basic metabolic panel  Result Value Ref Range   Glucose 108 (H) 70 - 99 mg/dL   BUN 12 8 - 27 mg/dL   Creatinine, Ser 2.95 0.57 - 1.00 mg/dL   eGFR 77 >28 UX/LKG/4.01   BUN/Creatinine Ratio 14 12 - 28   Sodium 144 134 - 144 mmol/L   Potassium 3.5 3.5 - 5.2 mmol/L   Chloride 100 96 - 106 mmol/L   CO2 29 20 - 29 mmol/L   Calcium 10.0 8.7 - 10.3 mg/dL  Hepatic function panel  Result Value Ref Range   Total Protein 7.7 6.0 - 8.5 g/dL   Albumin 4.4 3.9 - 4.9 g/dL   Bilirubin Total 0.3 0.0 - 1.2 mg/dL   Bilirubin, Direct 0.27 0.00 - 0.40 mg/dL   Alkaline Phosphatase 67 44 - 121 IU/L   AST 32 0 - 40 IU/L   ALT 33 (H) 0 -  32 IU/L  Hemoglobin A1c  Result Value Ref Range   Hgb A1c MFr Bld 6.2 (H) 4.8 - 5.6 %   Est. average glucose Bld gHb Est-mCnc 131 mg/dL    Outpatient Encounter Medications as of 06/17/2023  Medication Sig   ALPRAZolam (XANAX) 0.5 MG tablet 1/2-1 po BID prn anxiety   Calcium Carbonate-Vitamin D (CALCIUM + D PO) Take 500 mg by mouth daily.   diclofenac (VOLTAREN) 75 MG EC tablet Take 75 mg by mouth 2 (two) times daily.   HYDROcodone-acetaminophen (NORCO/VICODIN) 5-325 MG tablet    methocarbamol (ROBAXIN) 500 MG tablet Take 500 mg by mouth every 6 (six) hours as needed.   valACYclovir (VALTREX) 500 MG tablet Take 2 tablets (1,000 mg total) by mouth 2 (two) times daily. Take 2 tablets (1,000 mg total) by mouth 2 (two) times daily for 5 days for outbreaks.   [DISCONTINUED] amLODipine (NORVASC) 5 MG tablet Take 1 tablet (5 mg total) by mouth daily.   [DISCONTINUED] indapamide (LOZOL) 1.25 MG tablet Take 1 tablet (1.25 mg total) by mouth daily.   [DISCONTINUED] potassium chloride (KLOR-CON) 10 MEQ tablet 2 qd for hypokalemia   [DISCONTINUED] rosuvastatin (CRESTOR) 10 MG tablet Take 1 tablet (10 mg total) by mouth daily.   amLODipine (NORVASC) 5 MG tablet Take 1 tablet (5 mg total) by mouth daily.   indapamide (LOZOL) 1.25 MG tablet Take 1  tablet (1.25 mg total) by mouth daily.   Multiple Vitamins-Minerals (CENTRUM SILVER ADULT 50+ PO) Take 1 tablet by mouth daily. (Patient not taking: Reported on 06/17/2023)   potassium chloride (KLOR-CON) 10 MEQ tablet 2 qd for hypokalemia   rosuvastatin (CRESTOR) 10 MG tablet Take 1 tablet (10 mg total) by mouth daily.   No facility-administered encounter medications on file as of 06/17/2023.    Past Medical History:  Diagnosis Date   Anemia    Fibroids    Fibroids    H/O   H/O menorrhagia    Herpes simplex without mention of complication    HSV 2   Hyperlipidemia    Hypertension    Viral meningitis 06/2008   WBC decreased 2009      Review of Systems     Objective:   Physical Exam General-in no acute distress Eyes-no discharge Lungs-respiratory rate normal, CTA CV-no murmurs,RRR Extremities skin warm dry no edema Neuro grossly normal Behavior normal, alert Patient has swelling in the left knee as well as decreased range of motion       Assessment & Plan:  1. HTN (hypertension), benign Blood pressure decent control continue current measures  2. Prediabetes Lab work points toward prediabetes portion control minimize sugars stay physically active  3. Hyperlipidemia, unspecified hyperlipidemia type Mild hyperlipidemia does not indicate need to go up on medicine she had stopped her Crestor for a while recently restarted  4. Chronic pain of left knee Patella fracture on Unfortunately has debilitating injury with loss of function Long-term more than likely will not gather all of this back  May use diclofenac as needed for discomfort recommend long-term to get away from muscle relaxer and oxycodone Get away from oxy and muscle relaxer long term Labs before next visitLabs before next visit

## 2023-06-18 DIAGNOSIS — M25662 Stiffness of left knee, not elsewhere classified: Secondary | ICD-10-CM | POA: Diagnosis not present

## 2023-06-19 ENCOUNTER — Other Ambulatory Visit: Payer: Self-pay

## 2023-06-19 DIAGNOSIS — Z79899 Other long term (current) drug therapy: Secondary | ICD-10-CM

## 2023-06-19 DIAGNOSIS — I10 Essential (primary) hypertension: Secondary | ICD-10-CM

## 2023-06-19 DIAGNOSIS — R7303 Prediabetes: Secondary | ICD-10-CM

## 2023-06-19 DIAGNOSIS — E785 Hyperlipidemia, unspecified: Secondary | ICD-10-CM

## 2023-06-20 DIAGNOSIS — M25662 Stiffness of left knee, not elsewhere classified: Secondary | ICD-10-CM | POA: Diagnosis not present

## 2023-08-15 ENCOUNTER — Telehealth: Payer: Self-pay | Admitting: Family Medicine

## 2023-08-15 NOTE — Telephone Encounter (Signed)
Patient called today checking on her follow up appointment you didn't put in your notes from 7/22 when she needed to return. Please advise.

## 2023-08-15 NOTE — Telephone Encounter (Signed)
Her last visit was in July I would recommend a follow-up visit early January do labs before that visit please labs have been ordered

## 2023-09-09 ENCOUNTER — Encounter: Payer: Self-pay | Admitting: Family Medicine

## 2023-09-20 DIAGNOSIS — M24662 Ankylosis, left knee: Secondary | ICD-10-CM | POA: Diagnosis not present

## 2023-09-20 DIAGNOSIS — S82002D Unspecified fracture of left patella, subsequent encounter for closed fracture with routine healing: Secondary | ICD-10-CM | POA: Diagnosis not present

## 2023-11-23 ENCOUNTER — Encounter: Payer: Self-pay | Admitting: Family Medicine

## 2023-11-26 NOTE — Telephone Encounter (Signed)
 Front We certainly would desire to be helpful for [name]  I would recommend an office visit with her within the next 30 days to be able to document within the confines of an office visit how the symptoms and problems are affecting her  This will also help give us  necessary up-to-date information that would assist with her filing for disability

## 2023-11-29 ENCOUNTER — Ambulatory Visit: Payer: 59 | Admitting: Family Medicine

## 2024-01-23 ENCOUNTER — Other Ambulatory Visit: Payer: Self-pay | Admitting: Family Medicine

## 2024-02-05 ENCOUNTER — Encounter: Payer: Self-pay | Admitting: Internal Medicine

## 2024-03-24 ENCOUNTER — Encounter: Admitting: Internal Medicine

## 2024-04-06 ENCOUNTER — Encounter: Admitting: Internal Medicine
